# Patient Record
Sex: Female | Born: 1985 | Race: White | Hispanic: No | Marital: Single | Smoking: Former smoker
Health system: Southern US, Academic
[De-identification: ages and names within clinical notes are randomized; demographics above are authoritative.]

## PROBLEM LIST (undated history)

## (undated) DIAGNOSIS — F419 Anxiety disorder, unspecified: Secondary | ICD-10-CM

## (undated) DIAGNOSIS — R51 Headache: Secondary | ICD-10-CM

## (undated) DIAGNOSIS — F32A Depression, unspecified: Secondary | ICD-10-CM

## (undated) DIAGNOSIS — B019 Varicella without complication: Secondary | ICD-10-CM

## (undated) DIAGNOSIS — R519 Headache, unspecified: Secondary | ICD-10-CM

## (undated) DIAGNOSIS — F329 Major depressive disorder, single episode, unspecified: Secondary | ICD-10-CM

## (undated) DIAGNOSIS — J45909 Unspecified asthma, uncomplicated: Secondary | ICD-10-CM

## (undated) DIAGNOSIS — N289 Disorder of kidney and ureter, unspecified: Secondary | ICD-10-CM

## (undated) DIAGNOSIS — K469 Unspecified abdominal hernia without obstruction or gangrene: Secondary | ICD-10-CM

## (undated) DIAGNOSIS — I519 Heart disease, unspecified: Secondary | ICD-10-CM

## (undated) HISTORY — DX: Disorder of kidney and ureter, unspecified: N28.9

## (undated) HISTORY — DX: Heart disease, unspecified: I51.9

## (undated) HISTORY — DX: Headache: R51

## (undated) HISTORY — PX: OTHER SURGICAL HISTORY: SHX169

## (undated) HISTORY — DX: Varicella without complication: B01.9

## (undated) HISTORY — PX: HERNIA REPAIR: SHX51

## (undated) HISTORY — PX: BREAST SURGERY: SHX581

---
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
## 2004-12-28 ENCOUNTER — Ambulatory Visit (HOSPITAL_COMMUNITY): Payer: Self-pay

## 2005-06-22 ENCOUNTER — Ambulatory Visit (INDEPENDENT_AMBULATORY_CARE_PROVIDER_SITE_OTHER): Payer: Self-pay

## 2005-06-25 ENCOUNTER — Ambulatory Visit (INDEPENDENT_AMBULATORY_CARE_PROVIDER_SITE_OTHER): Payer: Self-pay | Admitting: Hospital-Specialty Hospital

## 2005-07-13 ENCOUNTER — Ambulatory Visit (INDEPENDENT_AMBULATORY_CARE_PROVIDER_SITE_OTHER): Payer: Self-pay | Admitting: Hospital-Specialty Hospital

## 2005-08-06 ENCOUNTER — Emergency Department (HOSPITAL_COMMUNITY): Payer: Self-pay | Admitting: Emergency Medicine

## 2005-09-12 ENCOUNTER — Ambulatory Visit (HOSPITAL_COMMUNITY): Payer: Self-pay | Admitting: Hospital-Specialty Hospital

## 2005-09-14 ENCOUNTER — Ambulatory Visit (INDEPENDENT_AMBULATORY_CARE_PROVIDER_SITE_OTHER): Payer: Self-pay

## 2005-12-12 ENCOUNTER — Ambulatory Visit (INDEPENDENT_AMBULATORY_CARE_PROVIDER_SITE_OTHER): Payer: Self-pay | Admitting: Hospital-Specialty Hospital

## 2005-12-15 ENCOUNTER — Ambulatory Visit (HOSPITAL_COMMUNITY): Payer: Self-pay | Admitting: Obstetrics & Gynecology

## 2006-01-08 ENCOUNTER — Ambulatory Visit (INDEPENDENT_AMBULATORY_CARE_PROVIDER_SITE_OTHER): Payer: Self-pay | Admitting: Hospital-Specialty Hospital

## 2006-01-11 ENCOUNTER — Ambulatory Visit (INDEPENDENT_AMBULATORY_CARE_PROVIDER_SITE_OTHER): Payer: Self-pay | Admitting: Hospital-Specialty Hospital

## 2006-01-15 ENCOUNTER — Ambulatory Visit (INDEPENDENT_AMBULATORY_CARE_PROVIDER_SITE_OTHER): Payer: Self-pay | Admitting: Hospital-Specialty Hospital

## 2006-01-18 ENCOUNTER — Ambulatory Visit (INDEPENDENT_AMBULATORY_CARE_PROVIDER_SITE_OTHER): Payer: Self-pay

## 2006-01-20 ENCOUNTER — Ambulatory Visit (HOSPITAL_COMMUNITY): Payer: Self-pay

## 2006-01-22 ENCOUNTER — Ambulatory Visit (INDEPENDENT_AMBULATORY_CARE_PROVIDER_SITE_OTHER): Payer: Self-pay | Admitting: Hospital-Specialty Hospital

## 2006-01-25 ENCOUNTER — Inpatient Hospital Stay (HOSPITAL_COMMUNITY): Payer: Self-pay | Admitting: Obstetrics & Gynecology

## 2007-07-30 ENCOUNTER — Ambulatory Visit (INDEPENDENT_AMBULATORY_CARE_PROVIDER_SITE_OTHER): Payer: Self-pay | Admitting: GENERAL SURGERY

## 2007-08-18 ENCOUNTER — Emergency Department (EMERGENCY_DEPARTMENT_HOSPITAL): Admission: EM | Admit: 2007-08-18 | Discharge: 2007-08-18 | Payer: Self-pay

## 2007-08-18 ENCOUNTER — Encounter (EMERGENCY_DEPARTMENT_HOSPITAL): Payer: Self-pay

## 2007-10-22 ENCOUNTER — Encounter (INDEPENDENT_AMBULATORY_CARE_PROVIDER_SITE_OTHER): Payer: Self-pay | Admitting: GENERAL SURGERY

## 2007-10-29 ENCOUNTER — Encounter (INDEPENDENT_AMBULATORY_CARE_PROVIDER_SITE_OTHER): Payer: 59 | Admitting: GENERAL SURGERY

## 2007-10-29 ENCOUNTER — Encounter (HOSPITAL_COMMUNITY): Payer: Self-pay

## 2007-10-29 ENCOUNTER — Ambulatory Visit
Admission: RE | Admit: 2007-10-29 | Discharge: 2007-10-29 | Disposition: A | Discharge status: Home / Self Care | Payer: 59 | Attending: GENERAL SURGERY | Admitting: GENERAL SURGERY

## 2007-10-29 DIAGNOSIS — Z01818 Encounter for other preprocedural examination: Secondary | ICD-10-CM | POA: Insufficient documentation

## 2007-10-29 HISTORY — DX: Unspecified asthma, uncomplicated: J45.909

## 2007-10-29 HISTORY — DX: Unspecified abdominal hernia without obstruction or gangrene: K46.9

## 2007-10-29 LAB — CBC/DIFF
BASOPHILS: 0 % (ref 0–1)
BASOS ABS: 0.029 THOU/uL (ref 0.0–0.2)
EOS ABS: 0.203 THOU/uL (ref 0.1–0.3)
EOSINOPHIL: 3 % (ref 1–6)
HCT: 40.1 % (ref 33.5–45.2)
HGB: 13.1 g/dL (ref 11.2–15.2)
LYMPHOCYTES: 49 % — ABNORMAL HIGH (ref 20–45)
LYMPHS ABS: 3.27 THOU/uL (ref 1.0–4.8)
MCH: 27.4 pg (ref 27.4–33.0)
MCHC: 32.8 g/dL (ref 31.6–35.5)
MCV: 83.6 fL (ref 78–100)
MONOCYTES: 5 % (ref 4–13)
MONOS ABS: 0.339 THOU/uL (ref 0.1–0.9)
MPV: 8.5 FL (ref 7.4–10.4)
PLATELET COUNT: 339 THO/UL (ref 140–450)
PMN ABS: 2.94 THOU/uL (ref 1.3–7.7)
PMN'S: 43 % (ref 40–75)
RBC: 4.8 MIL/uL (ref 3.84–5.04)
RDW: 12.3 % (ref 11.5–14.5)
WBC: 6.8 THOU/UL (ref 3.5–11.0)

## 2007-10-29 LAB — PTT (PARTIAL THROMBOPLASTIN TIME): APTT: 28.6 s (ref 22.5–32.0)

## 2007-10-29 LAB — ELECTROLYTES
ANION GAP: 8 mmol/L (ref 5–16)
CARBON DIOXIDE: 26 mmol/L (ref 22–32)
CHLORIDE: 102 mmol/L (ref 96–111)
POTASSIUM: 3.4 mmol/L — ABNORMAL LOW (ref 3.5–5.1)
SODIUM: 136 mmol/L (ref 136–145)

## 2007-10-29 LAB — URINALYSIS, MACROSCOPIC AND MICROSCOPIC
GLUCOSE: NEGATIVE mg/dL
KETONES: NEGATIVE mg/dL
LEUKOCYTES: NEGATIVE
NITRITE: NEGATIVE
PH URINE: 5.5 (ref 5.0–8.0)
SPECIFIC GRAVITY, URINE: 1.018 (ref 1.005–1.030)
WBC'S: 1 /HPF (ref 0–6)

## 2007-10-29 LAB — CREATININE WITH EGFR: ESTIMATED GLOMERULAR FILTRATION RATE: >59 ml/min/1.73m2 (ref >59)

## 2007-10-29 LAB — BUN: BUN/CREAT RATIO: 17 (ref 6–22)

## 2007-10-29 LAB — PT/INR
INR: 1 (ref 0.8–1.2)
PROTHROMBIN TIME: 10.7 s (ref 9.1–11.2)

## 2007-10-29 NOTE — Nurses Notes (Signed)
Pt states her PPD was "red" around the site and was told to get a yearly CXR.  She stated her CXR has been negative.[facility] 5/09 was last one done)

## 2007-10-30 ENCOUNTER — Ambulatory Visit (HOSPITAL_COMMUNITY): Payer: 59

## 2007-11-01 MED ORDER — DEXTROSE 5 % IN WATER (D5W) INTRAVENOUS SOLUTION
600.00 mg | Freq: Once | INTRAVENOUS | Status: DC
Start: 2007-11-03 — End: 2007-11-04
  Filled 2007-11-01: qty 4

## 2007-11-03 ENCOUNTER — Other Ambulatory Visit (HOSPITAL_COMMUNITY): Payer: Self-pay | Admitting: GENERAL SURGERY

## 2007-11-03 ENCOUNTER — Inpatient Hospital Stay
Admission: RE | Admit: 2007-11-03 | Discharge: 2007-11-03 | Disposition: A | Discharge status: Home / Self Care | Payer: 59 | Attending: GENERAL SURGERY | Admitting: GENERAL SURGERY

## 2007-11-03 ENCOUNTER — Encounter (HOSPITAL_BASED_OUTPATIENT_CLINIC_OR_DEPARTMENT_OTHER): Payer: 59 | Admitting: GENERAL SURGERY

## 2007-11-03 ENCOUNTER — Encounter (HOSPITAL_COMMUNITY): Payer: Self-pay

## 2007-11-03 ENCOUNTER — Encounter (HOSPITAL_COMMUNITY)
Admission: RE | Disposition: A | Discharge status: Home / Self Care | Payer: Self-pay | Source: Ambulatory Visit | Attending: GENERAL SURGERY

## 2007-11-03 DIAGNOSIS — K429 Umbilical hernia without obstruction or gangrene: Secondary | ICD-10-CM | POA: Insufficient documentation

## 2007-11-03 DIAGNOSIS — F17200 Nicotine dependence, unspecified, uncomplicated: Secondary | ICD-10-CM | POA: Insufficient documentation

## 2007-11-03 SURGERY — REPAIR HERNIA VENTRAL
Anesthesia: General | Wound class: Clean Wound: Uninfected operative wounds in which no inflammation occurred

## 2007-11-03 MED ORDER — OXYCODONE-ACETAMINOPHEN 5 MG-325 MG TABLET
ORAL_TABLET | ORAL | Status: AC
Start: 2007-11-03 — End: 2007-11-03
  Filled 2007-11-03: qty 1

## 2007-11-03 MED ORDER — OXYCODONE-ACETAMINOPHEN 5 MG-325 MG TABLET
1.0000 | ORAL_TABLET | ORAL | Status: DC | PRN
Start: 2007-11-03 — End: 2007-11-04
  Administered 2007-11-03 (×2): 1 via ORAL

## 2007-11-03 MED ORDER — OXYCODONE-ACETAMINOPHEN 5 MG-325 MG TABLET
1.00 | ORAL_TABLET | ORAL | Status: DC | PRN
Start: 2007-11-03 — End: 2009-03-04

## 2007-11-03 MED ORDER — LACTATED RINGERS INTRAVENOUS SOLUTION
INTRAVENOUS | Status: DC
Start: 2007-11-03 — End: 2007-11-04

## 2007-11-03 MED ORDER — METOCLOPRAMIDE 10 MG TABLET
10.0000 mg | ORAL_TABLET | Freq: Three times a day (TID) | ORAL | Status: DC | PRN
Start: 2007-11-03 — End: 2007-11-04
  Filled 2007-11-03: qty 1

## 2007-11-03 MED ORDER — BUPIVACAINE (PF) 0.25 % (2.5 MG/ML) INJECTION SOLUTION
Freq: Once | INTRAMUSCULAR | Status: DC | PRN
Start: 2007-11-03 — End: 2007-11-03
  Administered 2007-11-03: 7 mL via INTRAMUSCULAR

## 2007-11-03 SURGICAL SUPPLY — 15 items
APPL 70% ISPRP 2% CHG 26ML CHLRPRP HI-LT ORNG PREP STRL LF  DISP CLR (WOUND CARE SUPPLY) ×1 IMPLANT
BLANKET 3M BAIR HUG ADLT UPR B ODY 74X24IN PLMR 2 INCS ADH (MISCELLANEOUS PT CARE ITEMS) ×1 IMPLANT
CONV USE ITEM 156524 - ADHESIVE TISSUE EXOFIN 1.0ML_PREMIERPRO EXOFIN (SEALANTS) IMPLANT
CONV USE ITEM 338653 - PACK SURG ABDOMINAL NONST DISP LF (CUSTOM TRAYS & PACK) ×1 IMPLANT
DONUT EXTREMITY CUSHIONING 31143137 (POSITIONING PRODUCTS) ×1 IMPLANT
DRESS WOUND PRMPR NWVN LF  STRL DISP (WOUND CARE SUPPLY) ×1 IMPLANT
KIT RM TURNOVER CLEANOP CSTM INFCT CONTROL (KITS & TRAYS (DISPOSABLE))
KIT RM TURNOVER CLEANOP CUSTOM INFCT CONTROL (KITS & TRAYS (DISPOSABLE)) IMPLANT
NEEDLE 22 GA X 1.5 1188822112 100EA/BX (NEEDLES & SYRINGE SUPPLIES) ×1 IMPLANT
PACK SURG ABDOMINAL NONST DISP LF (CUSTOM TRAYS & PACK) ×1
PAD ARMBRD BLU (POSITIONING PRODUCTS) ×2 IMPLANT
SLEEVE SCD EXPRESS KNEE REG 5 PER CASE 9529 (EQUIPMENT MINOR) ×1 IMPLANT
STAPLER SKIN 4.1X6.5MM 35 W STPL CART LF  APS U DISP CLR SS PLASTIC (ENDOSCOPIC SUPPLIES) IMPLANT
SUTURE TICRON 0 HGS22 TAPER POPOFF NDL 8886337962 24/BX (SUTURE/WOUND CLOSURE) ×1 IMPLANT
SYRINGE 12CC LL CONTROL TOP 8881512977 160/CS (Syringes w/ o Needles) ×1 IMPLANT

## 2007-11-03 NOTE — Discharge Instructions (Signed)
SURGICAL DISCHARGE INSTRUCTIONS     Dr. Roberto Scales  performed your REPAIR HERNIA VENTRAL today at the [facility]    [facility]:  Monday through Friday from 6 a.m. - 7 p.m.: [phone]  Between 7 p.m. - 6 a.m., weekends and holidays:  Call [facility] at [phone] or [phone].    PLEASE SEE WRITTEN HANDOUTS AS DISCUSSED BY YOUR NURSE:  Hernia repair    SIGNS AND SYMPTOMS OF A WOUND / INCISION INFECTION   Be sure to watch for the following:   Increase in redness or red streaks near or around the wound or incision.  Increase in pain that is intense or severe and cannot be relieved by the pain medication that your doctor has given you.  Increase in swelling that cannot be relieved by elevation of a body part, or by applying ice, if permitted.  Increase in drainage, or if yellow / green in color and smells bad. This could be on a dressing or a cast.  Increase in fever for longer than 24 hours, or an increase that is higher than 101 degrees Fahrenheit (normal body temperature is 98 degrees Fahrenheit). The incision may feel warm to the touch.    **CALL YOUR DOCTOR IF ONE OR MORE OF THESE SIGNS / SYMPTOMS SHOULD OCCUR.    ANESTHESIA INFORMATION   ANESTHESIA -- ADULT PATIENTS:  You have received intravenous sedation / general anesthesia, and you may feel drowsy and light-headed for several hours. You may even experience some forgetfulness of the procedure. DO NOT DRIVE A MOTOR VEHICLE or perform any activity requiring complete alertness or coordination until you feel fully awake in about 24-48 hours. Do not drink alcoholic beverages for at least 24 hours. Do not stay alone, you must have a responsible adult available to be with you. You may also experience a dry mouth or nausea for 24 hours. This is a normal side effect and will disappear as the effects of the medication wear off.    REMEMBER   If you experience any difficulty breathing, chest pain, bleeding that you feel is  excessive, persistent nausea or vomiting or for any other concerns:  Call your physician Dr. Luiz Blare at [phone] or [phone]. You may also ask to have the surgical doctor on call paged. They are available to you 24 hours a day.    SPECIAL INSTRUCTIONS / COMMENTS   none    FOLLOW-UP APPOINTMENTS   Please call patient services at [phone] or [phone] to schedule a date / time of return. They are open Monday - Friday from 7:30 am - 5:00 pm.

## 2007-11-03 NOTE — Nurses Notes (Signed)
Pt. Started menses on 10/25/07, Dr. Raechel Ache d/cd order for [facility].

## 2007-11-03 NOTE — OR PostOp (Signed)
Dc to home via w/c with [facility].

## 2007-11-04 LAB — HISTORICAL SURGICAL PATHOLOGY SPECIMEN

## 2007-11-04 NOTE — OR Surgeon (Signed)
[facility]   DEPARTMENT OF SURGERY   OPERATION SUMMARY    PATIENT NAME: Anne Cummings, Anne Cummings  HOSPITAL BMWUXL:[id number]  DATE OF SERVICE:11/03/2007  DATE OF BIRTH: 19-May-1985    PREOPERATIVE DIAGNOSIS: Umbilical hernia.    POSTOPERATIVE DIAGNOSIS: Umbilical hernia.    NAME OF PROCEDURE: Umbilical hernia repair.    SURGEONS: Jonnie Finner, MD, Marguerite Olea MD.    ANESTHESIA: General endotracheal anesthesia.    SPECIMENS: Hernia sac.    ANTIBIOTICS: Clindamycin.    FLUIDS: 700 mL of crystalloid.     URINE OUTPUT: 25 mL.    ESTIMATED BLOOD LOSS: Minimal.     DESCRIPTION OF PROCEDURE: After informed consent was obtained, the patient was brought to the operating room and placed on the operating room table in supine position. The patient underwent general endotracheal anesthesia. The patient was prepped and draped in a sterile fashion. An approximately 3-cm curvilinear incision was made in a transverse fashion in the supraumbilical position. The incision was carried down through the skin and subcutaneous tissue. The dissection was carried down to the level of the fascia. The umbilical stalk was amputated from the fascia. After elevating the umbilical stalk, a small umbilical defect was noted above the site where the umbilical stalk was implanted. The defect was approximately 1 cm in diameter. The fascia was cleared of its overlying tissue. This tissue was sent as a hernia sac. The level of the umbilical stalk was also noted to have a small defect in the fascia and the 2 were connected. This made an approximately 1.5-cm defect. The fascial edges were reapproximated using 0 Tycron suture in a figure-of-eight suture fashion. Three sutures were placed. The wound was irrigated with saline solution. The umbilical stalk was reattached to the fascia using 3-0 Polysorb in an interrupted fashion. Deep dermal sutures were placed in an interrupted suture fashion and the skin edges were reapproximated using 4-0 Polysorb in a subcuticular suture fashion. The patient tolerated the procedure well and was without apparent complication. Sponge and instrument count was correct at the completion of the procedure and I was scrubbed and present throughout the entire procedure.      Roberto Scales, MD  Assistant Professor  [facility] Department of Surgery    [id number]; D: 11/03/2007 59:56:38; T: 11/04/2007 12:58:47

## 2007-11-14 ENCOUNTER — Encounter (INDEPENDENT_AMBULATORY_CARE_PROVIDER_SITE_OTHER): Payer: 59

## 2007-11-17 NOTE — Progress Notes (Signed)
I saw and evaluated the patient. I reviewed the resident's note. I agree with the findings and plan of care as documented in the resident's note. Any exceptions/additions are noted.

## 2007-11-17 NOTE — H&P (Signed)
 [facility] Department of Surgery  [address]  [place], [place] [address]      HISTORY AND PHYSICAL    PATIENT NAME: Anne Cummings, Anne Cummings  CHART NUMBER: [id number]  DATE OF BIRTH: 03/27/1986  DATE OF SERVICE: 10/29/2007    CHIEF COMPLAINT:  Ventral hernia.    SUBJECTIVE:  Anne Cummings is a 22 year old female who initially presented in April with complaint of an 34-month enlarging supraumbilical ventral hernia.  She states that the hernia not been changing in size.  This does cause discomfort when she is active and lifting items.  She has not noticed any redness or swelling in the area of the hernia.  Per the patient, the hernia is reducible.  She has not had any change in her bowel pattern, such as constipation or diarrhea.  She has not noted any blood in her stool.      PAST MEDICAL HISTORY:  Gestational diabetes, resolved.      PAST SURGICAL HISTORY:  Status post C section in September 2007.    MEDICATIONS:  Tri-Sprintec .      ALLERGIES:    1.  Nonsteroidal anti-inflammatory drugs cause anaphylaxis.  2.  Penicillin, which causes rash.     FAMILY HISTORY:    1.  Grandfather with umbilical hernia.  2.  Diabetes in mom and several aunts.  3.  Both parents with hypertension.      SOCIAL HISTORY:  The patient does smoke one-half pack per day.  Consumes 1-2 alcoholic drinks per week.  No drugs.  Single.  Lives with her son.      REVIEW OF SYSTEMS:  Constitutional:  The patient does report an intentional 10-pound weight loss over the past several months.  No fever, no chills, no night sweats.  Cardiovascular:  No chest pain, no palpitations.  Respiratory:  No dyspnea, no cough. GI:  No diarrhea, no constipation, no blood per rectum.  No nausea, vomiting.  GU:  No dysuria.  No frequency.  Reproductive:  LMP 10/23/2007, lasted 3 days.      OBJECTIVE:  Vital Signs:  Heart rate 68, blood pressure 100/60, respirations 16, temperature 97.4.  Weight 111 pounds.  On physical examination, constitutional:  Well-kempt  patient appearing normal age and in no acute distress.  HEENT:  Pupils equal, round, and reactive to light and accommodation.  No lymphadenopathy noted.  Cardiovascular:  Regular rate and rhythm.  Normal S1, S2.  No murmur, gallop, or thrill.  Respiratory:  Clear to auscultation bilaterally.  GI:  Abdomen:  There is a palpable mass about 3 cm above the umbilicus measuring 5-6 cm in length.  The actual fascial defect is difficult to palpate.  There is diastasis of the rectus abdominis muscles.  Musculoskeletal:  Range of motion is normal.  There is no edema.  Neurologic:  Alert and oriented x3.  No focal deficits.      ASSESSMENT/PLAN:  Ventral hernia - The patient is scheduled for open operative repair of her ventral hernia for 11/03/2007 by Dr. Yvone.  The patient was consented.  The risks and benefits and complications of the procedure were explained to the patient.  She is agreeable to the consented procedure and signed copy is placed in the chart.  Preoperative anesthesia consult with labs and preoperative orders were given to the patient for her arrival on Monday morning.      Selinda Bihari, MD  Resident   Department of Surgery    Montie JULIANNA Yvone, MD  Assistant Professor  [facility] Department of Surgery    [id number]; D: 10/29/2007 14:09:56; T: 10/29/2007 14:40:43

## 2007-11-19 ENCOUNTER — Encounter (INDEPENDENT_AMBULATORY_CARE_PROVIDER_SITE_OTHER): Payer: 59 | Admitting: GENERAL SURGERY

## 2007-11-27 NOTE — Progress Notes (Signed)
[facility] Department of Surgery  [address]  [place], [place] [address]      PROGRESS NOTE    PATIENT NAME: Anne Cummings, Anne Cummings  CHART NUMBER: [id number]  DATE OF BIRTH: [birth year]  DATE OF SERVICE: 11/14/2007    REASON FOR OFFICE VISIT: Wound swelling after open umbilical hernia repair.    SUBJECTIVE: Anne Cummings is a patient of Dr. Luiz Blare, she had open umbilical hernia repair. She was complaining of wound swelling. No other complaints. No fever, no chills.    OBJECTIVE: On physical examination, vital signs are afebrile. Heart: S1, S2. Chest: Clear to auscultation. Abdomen was soft, nontender, nondistended, normal bowel sounds. Incisions healed well, except the periphery that was open and draining seroma.    ASSESSMENT AND PLAN: The patient was assessed to have a seroma that was drained by applying pressure at the bedside and there are no signs of infection. The patient was given a prescription for a binder and will follow up with Dr. Luiz Blare.      Zollie Beckers, MD  Assistant Professor  [facility] Department of Surgery    [id number]; D: 11/14/2007 15:15:10; T: 11/18/2007 13:08:65    cc: Roberto Scales MD   Shirleen Schirmer

## 2007-12-21 NOTE — Progress Notes (Signed)
[facility] Department of Surgery  [address]  [place], [place] [address]      PROGRESS NOTE    PATIENT NAME: Anne Cummings, Anne Cummings  CHART NUMBER: [id number]  DATE OF BIRTH: [birth year]  DATE OF SERVICE: 11/14/2007    Today's visit is a followup, unscheduled visit.     REASON FOR VISIT: Possible wound infection.    SUBJECTIVE: The patient presents to the clinic today ten days status post ventral abdominal hernia repair. No mesh was used. The patient complains of fluid leaking from the lateral right side of her incision, and also a fluid collection underneath the entire area of the incision. The patient denies any appearance of pus. States that it is more clear and pink in color. She has not had any fever or chills. The incision area has not been red, tender, or warm. She has not taken any antibiotics.     OBJECTIVE: The patient's vital signs are stable. She is afebrile. Examination of the abdomen reveals approximately 8 cm by 8 cm firm area in the periumbilical region. With mild pressure, there is a serosanguineous clear fluid expressed from the right lateral margin of the incision. Approximately 15 to 20 mL of serous fluid is expressed. There is no evidence for exudate or infection. The incision has healed over except for an area in the right lateral portion, which is approximately the size of a 27 gauge needle (as it is very small). The rest of the patient's abdomen is soft, nontender, and nondistended and active bowel sounds.      ASSESSMENT/PLAN: Postoperative fluid accumulation is most consistent with a seroma. The fluid was evacuated with gentle pressure approximately 15 to 20 mL in total. The incision was then re-covered with a 4 by 4 dressing and taped. We advised the patient that there is no sign of infection and that antibiotics are not currently indicated. The patient was given a prescription for an abdominal binder to close the potential space to prevent re-collection of the seroma. The patient was advised to followup with her scheduled appointment with Dr. Luiz Blare in approximately five days. If she has any further complications, any signs of infection such a fever, chills, redness, tenderness, or erythema, she is to seek medical care at the emergency department.     The patient was seen in conjunction with Dr. Zollie Beckers covering for Dr. Jonnie Finner.      Marguerite Olea, MD  Resident  [place] Department of Surgery    Roberto Scales, MD  Assistant Professor  [facility] Department of Surgery    [id number]; D: 11/14/2007 14:44:25; T: 11/18/2007 07:41:12

## 2007-12-21 NOTE — Progress Notes (Signed)
I saw and evaluated the patient. I reviewed the resident's note. I agree with the findings and plan of care as documented in the resident's note. Any exceptions/additions are noted.

## 2007-12-25 NOTE — Progress Notes (Signed)
 [facility] Department of Surgery  [address]  [place], [place] [address]      PROGRESS NOTE    PATIENT NAME: Anne Cummings, Anne Cummings  CHART NUMBER: [id number]  DATE OF BIRTH: Mar 08, 1986  DATE OF SERVICE: 11/19/2007    CHIEF COMPLAINT: Status post ventral hernia repair.     SUBJECTIVE: Anne Cummings is a 22 year old female who presents to clinic today status post ventral hernia repair. She has had decreased drainage and fluid output from the incision site since her last visit. She denies any fever or chills. She denies any pain at the incision site. She denies any erythema, swelling or tenderness. She states that the fluid is clear, straw-colored fluid, which has decreased over the past several days in quantity. She has not noticed any purulence from the incision.     OBJECTIVE: Pulse 82, blood pressure 112/68, respirations 20, temperature 99, height 5 feet, and weight 111 pounds. Constitutional: Well appearing and in no acute distress. HEENT: Pupils are equal, round, and reactive to light. Extraocular muscles are intact. Cardiovascular: Regular rate and rhythm, no murmur, rub or gallop. Chest: Clear to auscultation bilaterally. No adventitious sounds. GI: Soft, nontender, and nondistended. Active bowel sounds. The incision site is healed satisfactorily. There is no fluid expressed from the site. There is no evidence of residual seroma. There is no erythema or tenderness. GU: There is no flank tenderness. Musculoskeletal: Strength is 5/5 throughout. Neurologic: Conscious, alert and oriented x3. Exam is nonfocal.     ASSESSMENT/PLAN: Status post ventral abdominal hernia repair. The patient is doing quite well postoperatively. The incision is healed satisfactorily. The patient is to follow up on an as-needed basis.      Selinda Bihari, MD  Resident  [place] Department of Surgery    Montie JULIANNA Gavel, MD  Assistant Professor  [facility] Department of Surgery    [id number]; D: 11/19/2007 17:19:00; T: 11/21/2007  10:42:47

## 2007-12-25 NOTE — Progress Notes (Signed)
I saw and evaluated the patient. I reviewed the resident's note. I agree with the findings and plan of care as documented in the resident's note. Any exceptions/additions are noted.

## 2008-04-30 DIAGNOSIS — Z8759 Personal history of other complications of pregnancy, childbirth and the puerperium: Secondary | ICD-10-CM

## 2008-04-30 HISTORY — DX: Personal history of other complications of pregnancy, childbirth and the puerperium: Z87.59

## 2008-04-30 HISTORY — PX: HX SALPINGECTOMY: 2100001146

## 2008-05-11 ENCOUNTER — Ambulatory Visit (INDEPENDENT_AMBULATORY_CARE_PROVIDER_SITE_OTHER): Payer: Self-pay | Admitting: Dermatology

## 2008-06-05 ENCOUNTER — Encounter (EMERGENCY_DEPARTMENT_HOSPITAL): Payer: MEDICAID

## 2008-06-05 ENCOUNTER — Emergency Department (EMERGENCY_DEPARTMENT_HOSPITAL): Admission: EM | Admit: 2008-06-05 | Discharge: 2008-06-05 | Payer: Self-pay

## 2009-03-04 ENCOUNTER — Encounter (EMERGENCY_DEPARTMENT_HOSPITAL): Payer: Worker's Comp, Other unspecified

## 2009-03-04 ENCOUNTER — Emergency Department
Admission: EM | Admit: 2009-03-04 | Discharge: 2009-03-04 | Disposition: A | Discharge status: Home / Self Care | Payer: Worker's Comp, Other unspecified | Attending: Emergency Medicine-WVUH | Admitting: Emergency Medicine-WVUH

## 2009-03-04 ENCOUNTER — Encounter (HOSPITAL_COMMUNITY): Payer: Self-pay

## 2009-03-04 DIAGNOSIS — Z7721 Contact with and (suspected) exposure to potentially hazardous body fluids: Secondary | ICD-10-CM | POA: Insufficient documentation

## 2009-03-04 LAB — DRUG SCREEN, HIGH OPIATE CUTOFF, NO CONFIRMATION, URINE
AMPHETAMINE, URINE: NEGATIVE
BARBITURATE, URINE: NEGATIVE
BENZODIAZEPINE,URINE: NEGATIVE
CANNABINOID (THC), URINE: NEGATIVE
COCAINE METAB. URINE: NEGATIVE
METHADONE, URINE: NEGATIVE
OPIATE, URINE: NEGATIVE
PHENCYCLIDINE, URINE: NEGATIVE
PROPOXYPHENE, URINE: NEGATIVE

## 2009-03-04 LAB — CBC/DIFF
BASOPHILS: 0 % (ref 0–1)
BASOS ABS: 0.047 THOU/uL (ref 0.0–0.2)
EOS ABS: 0.394 THOU/uL — ABNORMAL HIGH (ref 0.1–0.3)
EOSINOPHIL: 4 % (ref 1–6)
HCT: 38.7 % (ref 33.5–45.2)
HGB: 12.9 g/dL (ref 11.5–15.2)
LYMPHOCYTES: 41 % (ref 20–45)
LYMPHS ABS: 4.2 THOU/uL (ref 1.0–4.8)
MCH: 28 pg (ref 27.4–33.0)
MCHC: 33.4 g/dL (ref 31.6–35.5)
MCV: 83.9 fL (ref 82.0–99.0)
MONOCYTES: 6 % (ref 4–13)
MONOS ABS: 0.633 THOU/uL (ref 0.1–0.9)
MPV: 8.3 FL (ref 7.4–10.4)
NRBC'S: 0 /100{WBCs}
PLATELET COUNT: 323 THO/UL (ref 140–450)
PMN ABS: 5.1 THOU/uL (ref 1.5–7.7)
PMN'S: 49 % (ref 40–75)
RBC: 4.61 MIL/uL (ref 3.84–5.04)
RDW: 11.9 % (ref 10.2–14.0)
WBC: 10.4 THOU/UL (ref 3.5–11.0)

## 2009-03-04 LAB — BASIC METABOLIC PANEL
ANION GAP: 9 mmol/L (ref 5–16)
BUN/CREAT RATIO: 18 (ref 6–22)
BUN: 10 mg/dL (ref 6–20)
CALCIUM: 9.1 mg/dL (ref 8.5–10.4)
CARBON DIOXIDE: 27 mmol/L (ref 22–32)
CHLORIDE: 104 mmol/L (ref 96–111)
CREATININE: 0.56 mg/dL (ref 0.49–1.10)
ESTIMATED GLOMERULAR FILTRATION RATE: >59 ml/min/1.73m2 (ref >59)
GLUCOSE,NONFAST: 98 mg/dL (ref 65–139)
POTASSIUM: 4 mmol/L (ref 3.5–5.1)
SODIUM: 140 mmol/L (ref 136–145)

## 2009-03-04 LAB — ALT (SGPT): ALT (SGPT): 10 U/L (ref 7–45)

## 2009-03-04 LAB — AST (SGOT): AST (SGOT): 17 U/L (ref 8–41)

## 2009-03-04 LAB — RAPID HIV1 ANTIBIODY (ED USE): RAPID HIV-1 Ab: NEGATIVE

## 2009-03-04 NOTE — ED Nurses Note (Signed)
Pt discharged per order. Pt verbalized understanding of discharge instructions . Pt ambulated from ED with no problem

## 2009-03-04 NOTE — ED Provider Notes (Signed)
HPI The pt is a 23 yo F who presents 2 days S/P needlestick from her workplace.  On 03/02/09 the pt was exposed to a pt of her's lancet.  The pt states her pt placed a used lancet to test his blood sugar in a paper cup.  She found the cup with the used lancet and attempted to shake the lancet into sharps container.  When she did this the lancet stuck through the side of the paper cup and stuck the pt's left 2nd digit.  The pt then went to her supervisor and HR and made a report.  The pt states her RN supervisor is currently taking the appropriate actions to obtain consent from the sourceof the exposure for testing. No further action was taken per the pt. The pt was told by her employer that she will need to be tested. The pt was told to go to the ED or urgent care for testing within the next few days.  The pt called [facility] and the [facility] urgent care and was told to [facility] the ED.  The pt present tonight wanting testing for a needlestick exposure.  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          Review of Systems   Constitutional: Negative for fever, chills, malaise/fatigue and weakness.   Skin: Negative for rash.   HENT: Negative for headaches and sore throat.    Gastrointestinal: Negative for nausea, vomiting and abdominal pain.   Musculoskeletal: Negative for myalgias and joint pain.   Neurological: Negative for dizziness.   All other systems reviewed and are negative.          History:   Filed Vitals:    03/04/2009  7:52 PM   BP: 110/62   Pulse: 84   Temp: 36.9 C (98.4 F)   Resp: 18   SpO2: 98%       Past Medical History:  Past Medical History   Diagnosis Date   . Hernia      r/t pregnancy   . Asthma          Past Surgical History:  Past Surgical History   Procedure Date   . Hx cesarean section          Social History:  History   Substance Use Topics   . Tobacco Use: Never   . Alcohol Use: No      2x month       History   Drug Use No          Family History:  History reviewed.  No pertinent family history.    Current outpatient prescriptions   Medication Sig   . DISCONTD: oxycodone-acetaminophen (PERCOCET) 5-325 mg Tab take 1-2 Tabs by mouth Every 4 hours as needed    . oral contraceptive (PATIENT'S OWN SUPPLY) take 1 Tab by mouth Once a day.        Allergies   Allergen Reactions   . Nsaids Anaphylaxis   . Naproxen Anaphylaxis   . Penicillins Rash     "fever blisters in & around mouth"            Physical Exam   Nursing note and vitals reviewed.  Constitutional: She is oriented. She appears well-developed and well-nourished. She appears not diaphoretic. No distress.   HENT:   Head: Normocephalic and atraumatic.   Right Ear: External ear normal.   Left Ear:  External ear normal.   Nose: Nose normal.   Mouth/Throat: Oropharynx is clear and moist.   Eyes: Conjunctivae and extraocular motions are normal. Pupils are equal, round, and reactive to light.   Neck: Normal range of motion.   Cardiovascular: Normal rate, regular rhythm, normal heart sounds and intact distal pulses.    Pulmonary/Chest: Effort normal and breath sounds normal. No respiratory distress.   Abdominal: Bowel sounds are normal. She exhibits no distension. Soft. No tenderness.   Musculoskeletal: Normal range of motion. She exhibits no edema and no tenderness.   Lymphadenopathy:     She has no cervical adenopathy.   Neurological: She is alert and oriented.   Skin: Skin is dry. No rash noted. She is not diaphoretic.   Psychiatric: She has a normal mood and affect. Her behavior is normal. Judgment and thought content normal.           ED Course:    Results:     ED Course: The following lab work was obtained and sent; CBC w/ diff, BMP, ALT, AST, rapid HIV, Hep. B surface antigen, Hep. B core antibody, Hep. B core IGM, AB, Hep. C antibody.    The pt has had the Hep. 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    The pt was advised she will need the following lab test repeated 6 months from the exposure date: HIV AB, HCV AB, Liver function tests.    Dr. Alla German and I went into discuss the Urine drug screen with the pt. The pt decided to have the UDS performed.      Risks, benefits, and indications of antiviral medications was discussed with the pt by myself and Dr. Alla German, as well as potential alternatives, were discussed with and understood by patient.  Patient stated understanding of these and elects to not take the medication at this time.    Adan Sis, ANP 03/04/2009, 8:54 PM    2107: Marye Round PA has assumed the care of Rolla Plate from Adan Sis FNP-BC after throughly discussing patient's condition and plan of care. Marye Round PA will follow up on any ancillary studies and provided any further treatment and management of the patient.      Evaluation Plan:

## 2009-03-04 NOTE — ED Nurses Note (Signed)
Pt presented to ED for blood work following body fluid exposure on 11/5. Pt is employee for [employer]. Pt has no other complaints. Needlestick site has no s/s infection. Pt resting comfortably at this time.

## 2009-03-04 NOTE — Discharge Instructions (Signed)
Needle Stick or Possible Disease Exposure   Any needle stick injury carries a risk of disease. These diseases include Hepatitis B, C and HIV/AIDS and tetanus. Even though the risk is low, the potential for contracting these life-threatening illnesses is very real. The sooner medical treatment is started, the better your chances are for preventing illness. Along with the health concerns, comes the stress and anxiety of worrying about whether or not you have been infected   RISKS    There is less than a one percent chance of getting Human Immunodeficiency Virus (HIV/AIDS) from a needle stick. HIV does not live for very long outside of the body. There is no cure for HIV/AIDS.    There is a thirty percent chance of getting Hepatitis B from a needlestick. The Hepatitis B virus can survive for up to six months outside the body. It can survive on a used needle. A vaccine against Hepatitis B is available. The Hepatitis B vaccine will reduce the risk of Hepatitis B infection from needle stick. There is no cure for Hepatitis B.    There is a four percent chance of getting Hepatitis C from a needle stick. There is no cure for Hepatitis C.    Fortunately, most people are vaccinated against Tetanus. Tetanus (lock-jaw) happens when a wound is contaminated with tetanus spores. A dirty, rusty needle can transmit those spores to a human. This causes spasms and contractions of muscles. These spasms are sometimes referred to as lock-jaw.   RISK OF HIV INFECTION AFTER PERCUTANEOUS EXPOSURE   Researchers have performed studies of healthcare workers who have had occupational exposure to HIV. The studies show that the risk of transmission is about 0.32% Brenton Grills, 1996). This is not as high as the risk for transmission of some other infectious diseases. For example, the risk of transmission following a single exposure to Hepatitis B is as high as 30 percent.   The risk of transmission is increased with exposures involving larger  amounts of blood or a deep injury. The risk of transmission increases if the source of the material is from a patient in the terminal stage of an HIV-related illness. The risk from mucous membrane and non-intact skin exposures are very low. It is too low to be reliably estimated from current studies.   PROPHYLACTIC (PREVENTATIVE) TREATMENT OF HIV EXPOSURE    Zidovudine (ZDV). In December 1995, the Centers for Disease Control (CDC) issued a report about zidovudine. The report suggested taking zidovudine (ZDV, previously called AZT) following occupational HIV exposure reduced the risk of HIV infection by 79 percent. The use of ZDV following occupational HIV exposure is now strongly recommended.    Lamivudine (3TC). In addition to taking ZDV, there are reasons for taking a second drug. This second drug is called Lamivudine (Epivir) (3TC). Taking this drug may help prevent HIV transmission. The addition of 3TC might be useful for two reasons:    It improves the effectiveness of ZDV. This is without any significant increase in side effects.    It reduces the problem of HIV resistance to ZDV.    Indinavir (IDV) and Saquinavir (SQV). A third drug, Indinavir (Crixivan) (IDV), or Saquinavir (Fortovase, SQV) are both protease inhibitors. This drug is often added for people who have had exposures with the highest risk of HIV transmission.   The CDC and the [place] Public Health Service are currently recommending the following treatment as soon as possible after exposure ( less than 2 hours). The treatment may continue for  up to 4 weeks:   Regimen Name Application Drug Regimen   Basic Occupational HIV exposures with a recognized transmission risk. This is based on risk factors identified in the CDC's case-control study (Cardo et al., 1997). 4 weeks (28 days) of both zidovudine and lamivudine.   Zidovudine, 600 mg every day, in divided doses. This means 300 mg, 2 times per day; 200 mg, 3 times per day; or 100 mg every 4 hours.    Lamivudine, 150mg , 2 times per day.   Expanded Occupational HIV exposures that pose an increased risk of transmission. These are exposures of larger volume of blood and/or higher virus titer in blood. This is based on the presence of >2 of the risk factors identified in the case-control study (Cardo et al., 1997). Basic regimen plus either:   Indinavir, 800 mg, every 8 hours, or Nelfinavir, 750 mg, 3 times per day.   RISKS OF TAKING HIV PROPHYLACTIC TREATMENT   These drugs appear to be safe when taken preventively by health care workers following occupational exposure to HIV. Severe toxic reactions have been uncommon. Some risks may be unforeseen. The most common side effects of treatment are:    Mild anemia. .    Lowered white blood cell count. .   Other fairly common adverse effects include:    Fatigue.   Sleep disturbances.   Nausea and vomiting.   Headache.      The following additional symptoms have been reported less frequently:    Fever.   Sweating.   Flank pain.   Shortness of breath.     Malaise.   Hair loss.   Loss of appetite.   Rash.     Muscle aches.   Abdominal pain.   Diarrhea.   Taste abnormalities.       Muscle or nerve inflammation.   Nerve conduction abnormalities.   Inflammation of the liver or pancreas.   Kidney stone formation.       Allergic reactions.   Seizures.      At the present time, Lamivudine (3TC), Indinavir (IDV), and Saquinavir (SQV) are not recommended in pregnancy following occupational HIV exposures.   CONTRACEPTION   All female and female health care workers should use barrier contraception (condoms) for sexual intercourse for three months following an exposure to HIV. Using this reduces the potential for transmission of the virus to a sexual partner. This is for the unlikely event that the exposed person does become infected with HIV. In addition, someone should use barrier contraception during this time period in order to avoid becoming pregnant while  taking anti-HIV treatment. They should also use barrier contraception to avoid becoming pregnant soon after taking the anti-HIV treatment. This will reduce the chance of the drug affecting a pregnancy. If you become pregnant while taking these medications, you should let your caregiver know immediately.   WARNINGS   People who have had life threatening allergic reactions to any of these medications should not take them. Patients who have bone marrow suppression or severely impaired kidney or liver function have an increased risk of serious side effects. Using anti-HIV therapy in combination with certain other drugs can increase drug levels and risk of toxic reactions.   Treatment is voluntary following a possible exposure to an infection. It is best if you make these decisions with complete knowledge of the possible problems or consequences.   These guidelines vary in each state and healthcare facility. It is necessary for you to check on the current  guidelines for their current recommendations in your [place], healthcare facility, and [facility] Department. You may also check with the [employer]. Some of the recommendations are:    The source individual's (the person from where the infection may have come) blood shall be tested as soon as feasible and after getting consent. These blood tests help determine HIV, HBV, and HCV infectivity. If consent is not obtained, it should be recorded that legally required consent could not be obtained. If the source individual is already known to be HIV, HBV, and/or HCV positive, new testing need not be performed.    Results of the source individual's testing will be made available to the exposed employee or person only after getting consent. The exposed employee or person will be informed of applicable laws and regulations concerning disclosure of the identity and infectious status of the source individual.    The exposed employee's blood will be collected as soon  possible. It will be tested after getting consent from the source person. If the employee consents to baseline blood collection, but does not consent at that time for HIV, HBV, and HCV serological testing, the sample shall be preserved for at least 90 days. If the employee decides to have the baseline sample tested within 90 days of the exposure incident, such testing will be done as soon as possible.    For after exposure prophylaxis (prevention), consider following the recommendations established by the U.S. Public Health Service. These guidelines are listed in the Medical Management of Individuals Exposed to Blood/Body Fluids:   l The employee must be made aware of the 2 to 24 hour window of efficacy (effectiveness) of chemical prophylaxis.   l The evaluation must include assessment for Hepatitis C virus.    Counseling is available in most institutions. Usually this is at no cost to employees, people exposed, or to their families. The counseling is usually about the implications of testing and post-exposure prophylaxis.   REDUCING THE RISK OF OCCUPATIONAL HIV & OTHER INFECTIONS   Exposures to diseases are entirely unintentional. It is best to take every precaution to prevent the problems that come with being exposed.    Take extreme precautions with sharps such as needles and scalpels.    Do not recap needles using two hands (use one-handed scoop if recapping is necessary). Investigate new safety devices designed to prevent injuries.    Wear face protection to prevent splashes in the eyes, nose, or mouth.    Protect open cuts or non-intact skin with personal protective equipment.   There is a risk of serious disease with needle stick injuries. It important to develop good habits. These include the most protective and beneficial habits to prevent this.   Most of this information is courtesy of the CDC.   Document Released: 04/16/2005 Document Re-Released: 10/08/2005   [facility] Patient Information 2008  [place], [place].    Needle Stick or Possible Disease Exposure   Any needle stick injury carries a risk of disease. These diseases include Hepatitis B, C and HIV/AIDS and tetanus. Even though the risk is low, the potential for contracting these life-threatening illnesses is very real. The sooner medical treatment is started, the better your chances are for preventing illness. Along with the health concerns, comes the stress and anxiety of worrying about whether or not you have been infected   RISKS    There is less than a one percent chance of getting Human Immunodeficiency Virus (HIV/AIDS) from a needle stick. HIV does  not live for very long outside of the body. There is no cure for HIV/AIDS.    There is a thirty percent chance of getting Hepatitis B from a needlestick. The Hepatitis B virus can survive for up to six months outside the body. It can survive on a used needle. A vaccine against Hepatitis B is available. The Hepatitis B vaccine will reduce the risk of Hepatitis B infection from needle stick. There is no cure for Hepatitis B.    There is a four percent chance of getting Hepatitis C from a needle stick. There is no cure for Hepatitis C.    Fortunately, most people are vaccinated against Tetanus. Tetanus (lock-jaw) happens when a wound is contaminated with tetanus spores. A dirty, rusty needle can transmit those spores to a human. This causes spasms and contractions of muscles. These spasms are sometimes referred to as lock-jaw.   RISK OF HIV INFECTION AFTER PERCUTANEOUS EXPOSURE   Researchers have performed studies of healthcare workers who have had occupational exposure to HIV. The studies show that the risk of transmission is about 0.32% Brenton Grills, 1996). This is not as high as the risk for transmission of some other infectious diseases. For example, the risk of transmission following a single exposure to Hepatitis B is as high as 30 percent.   The risk of transmission is increased with exposures  involving larger amounts of blood or a deep injury. The risk of transmission increases if the source of the material is from a patient in the terminal stage of an HIV-related illness. The risk from mucous membrane and non-intact skin exposures are very low. It is too low to be reliably estimated from current studies.   PROPHYLACTIC (PREVENTATIVE) TREATMENT OF HIV EXPOSURE    Zidovudine (ZDV). In December 1995, the Centers for Disease Control (CDC) issued a report about zidovudine. The report suggested taking zidovudine (ZDV, previously called AZT) following occupational HIV exposure reduced the risk of HIV infection by 79 percent. The use of ZDV following occupational HIV exposure is now strongly recommended.    Lamivudine (3TC). In addition to taking ZDV, there are reasons for taking a second drug. This second drug is called Lamivudine (Epivir) (3TC). Taking this drug may help prevent HIV transmission. The addition of 3TC might be useful for two reasons:    It improves the effectiveness of ZDV. This is without any significant increase in side effects.    It reduces the problem of HIV resistance to ZDV.    Indinavir (IDV) and Saquinavir (SQV). A third drug, Indinavir (Crixivan) (IDV), or Saquinavir (Fortovase, SQV) are both protease inhibitors. This drug is often added for people who have had exposures with the highest risk of HIV transmission.   The CDC and the [place] Public Health Service are currently recommending the following treatment as soon as possible after exposure ( less than 2 hours). The treatment may continue for up to 4 weeks:   Regimen Name Application Drug Regimen   Basic Occupational HIV exposures with a recognized transmission risk. This is based on risk factors identified in the CDC's case-control study (Cardo et al., 1997). 4 weeks (28 days) of both zidovudine and lamivudine.   Zidovudine, 600 mg every day, in divided doses. This means 300 mg, 2 times per day; 200 mg, 3 times per day; or 100 mg  every 4 hours.   Lamivudine, 150mg , 2 times per day.   Expanded Occupational HIV exposures that pose an increased risk of transmission. These are exposures of  larger volume of blood and/or higher virus titer in blood. This is based on the presence of >2 of the risk factors identified in the case-control study (Cardo et al., 1997). Basic regimen plus either:   Indinavir, 800 mg, every 8 hours, or Nelfinavir, 750 mg, 3 times per day.   RISKS OF TAKING HIV PROPHYLACTIC TREATMENT   These drugs appear to be safe when taken preventively by health care workers following occupational exposure to HIV. Severe toxic reactions have been uncommon. Some risks may be unforeseen. The most common side effects of treatment are:    Mild anemia. .    Lowered white blood cell count. .   Other fairly common adverse effects include:    Fatigue.   Sleep disturbances.   Nausea and vomiting.   Headache.      The following additional symptoms have been reported less frequently:    Fever.   Sweating.   Flank pain.   Shortness of breath.     Malaise.   Hair loss.   Loss of appetite.   Rash.     Muscle aches.   Abdominal pain.   Diarrhea.   Taste abnormalities.       Muscle or nerve inflammation.   Nerve conduction abnormalities.   Inflammation of the liver or pancreas.   Kidney stone formation.       Allergic reactions.   Seizures.      At the present time, Lamivudine (3TC), Indinavir (IDV), and Saquinavir (SQV) are not recommended in pregnancy following occupational HIV exposures.   CONTRACEPTION   All female and female health care workers should use barrier contraception (condoms) for sexual intercourse for three months following an exposure to HIV. Using this reduces the potential for transmission of the virus to a sexual partner. This is for the unlikely event that the exposed person does become infected with HIV. In addition, someone should use barrier contraception during this time period in order to avoid becoming  pregnant while taking anti-HIV treatment. They should also use barrier contraception to avoid becoming pregnant soon after taking the anti-HIV treatment. This will reduce the chance of the drug affecting a pregnancy. If you become pregnant while taking these medications, you should let your caregiver know immediately.   WARNINGS   People who have had life threatening allergic reactions to any of these medications should not take them. Patients who have bone marrow suppression or severely impaired kidney or liver function have an increased risk of serious side effects. Using anti-HIV therapy in combination with certain other drugs can increase drug levels and risk of toxic reactions.   Treatment is voluntary following a possible exposure to an infection. It is best if you make these decisions with complete knowledge of the possible problems or consequences.   These guidelines vary in each state and healthcare facility. It is necessary for you to check on the current guidelines for their current recommendations in your [place], healthcare facility, and [facility] [place] Department. You may also check with the [employer]. Some of the recommendations are:    The source individual's (the person from where the infection may have come) blood shall be tested as soon as feasible and after getting consent. These blood tests help determine HIV, HBV, and HCV infectivity. If consent is not obtained, it should be recorded that legally required consent could not be obtained. If the source individual is already known to be HIV, HBV, and/or HCV positive, new testing need not be performed.  Results of the source individual's testing will be made available to the exposed employee or person only after getting consent. The exposed employee or person will be informed of applicable laws and regulations concerning disclosure of the identity and infectious status of the source individual.    The exposed employee's blood will be  collected as soon possible. It will be tested after getting consent from the source person. If the employee consents to baseline blood collection, but does not consent at that time for HIV, HBV, and HCV serological testing, the sample shall be preserved for at least 90 days. If the employee decides to have the baseline sample tested within 90 days of the exposure incident, such testing will be done as soon as possible.    For after exposure prophylaxis (prevention), consider following the recommendations established by the U.S. Public Health Service. These guidelines are listed in the Medical Management of Individuals Exposed to Blood/Body Fluids:   l The employee must be made aware of the 2 to 24 hour window of efficacy (effectiveness) of chemical prophylaxis.   l The evaluation must include assessment for Hepatitis C virus.    Counseling is available in most institutions. Usually this is at no cost to employees, people exposed, or to their families. The counseling is usually about the implications of testing and post-exposure prophylaxis.   REDUCING THE RISK OF OCCUPATIONAL HIV & OTHER INFECTIONS   Exposures to diseases are entirely unintentional. It is best to take every precaution to prevent the problems that come with being exposed.    Take extreme precautions with sharps such as needles and scalpels.    Do not recap needles using two hands (use one-handed scoop if recapping is necessary). Investigate new safety devices designed to prevent injuries.    Wear face protection to prevent splashes in the eyes, nose, or mouth.    Protect open cuts or non-intact skin with personal protective equipment.   There is a risk of serious disease with needle stick injuries. It important to develop good habits. These include the most protective and beneficial habits to prevent this.   Most of this information is courtesy of the CDC.   Document Released: 04/16/2005 Document Re-Released: 10/08/2005   [facility] Patient  Information 2008 [place], [place].

## 2009-03-07 LAB — HEPATITIS B CORE IGM, AB: HEPATITIS B CORE AB, IGM: NEGATIVE

## 2009-03-07 LAB — HEPATITIS B CORE ANTIBODY: AB TO HEP. B CORE AG: NEGATIVE

## 2009-03-07 LAB — HEPATITIS B SURFACE ANTIGEN: HEPATITIS B SURFACE AG: NEGATIVE

## 2009-03-07 LAB — HEPATITIS C ANTIBODY SCREEN WITH REFLEX TO HCV PCR: HEPATITIS C ANTIBODY: NEGATIVE

## 2011-08-15 ENCOUNTER — Ambulatory Visit (INDEPENDENT_AMBULATORY_CARE_PROVIDER_SITE_OTHER): Payer: Self-pay | Admitting: Obstetrics-Cheat Lake

## 2011-09-04 ENCOUNTER — Ambulatory Visit (HOSPITAL_BASED_OUTPATIENT_CLINIC_OR_DEPARTMENT_OTHER): Payer: BC Managed Care – PPO | Admitting: Hospital-Specialty Hospital

## 2011-09-04 ENCOUNTER — Ambulatory Visit
Admission: RE | Admit: 2011-09-04 | Discharge: 2011-09-04 | Disposition: A | Discharge status: Home / Self Care | Payer: BC Managed Care – PPO | Source: Ambulatory Visit | Attending: Hospital-Specialty Hospital | Admitting: Hospital-Specialty Hospital

## 2011-09-04 ENCOUNTER — Encounter (HOSPITAL_BASED_OUTPATIENT_CLINIC_OR_DEPARTMENT_OTHER): Payer: Self-pay | Admitting: Hospital-Specialty Hospital

## 2011-09-04 ENCOUNTER — Ambulatory Visit (HOSPITAL_BASED_OUTPATIENT_CLINIC_OR_DEPARTMENT_OTHER)
Admission: RE | Admit: 2011-09-04 | Discharge: 2011-09-04 | Disposition: A | Discharge status: Home / Self Care | Payer: BC Managed Care – PPO | Source: Ambulatory Visit | Admitting: Gynecology

## 2011-09-04 VITALS — BP 106/64 | Ht 60.0 in | Wt 130.7 lb

## 2011-09-04 DIAGNOSIS — O200 Threatened abortion: Secondary | ICD-10-CM | POA: Insufficient documentation

## 2011-09-04 DIAGNOSIS — Z8759 Personal history of other complications of pregnancy, childbirth and the puerperium: Secondary | ICD-10-CM | POA: Insufficient documentation

## 2011-09-04 DIAGNOSIS — O039 Complete or unspecified spontaneous abortion without complication: Secondary | ICD-10-CM

## 2011-09-04 HISTORY — DX: Complete or unspecified spontaneous abortion without complication: O03.9

## 2011-09-04 LAB — HCG, PLASMA OR SERUM QUANTITATIVE, PREGNANCY: HCG QUANTITATIVE/PREG: 29248 m[IU]/mL — ABNORMAL HIGH (ref <5)

## 2011-09-04 NOTE — Progress Notes (Signed)
Patient comes for a second opinion after being seen at [facility] health care. Last menstrual period 06/27/11 with a history of previous ectopic pregnancy the right tube had a cesarean section with a previous intrauterine pregnancy. Ultrasound at their facility revealed absence of fetal heart tones with a 5 .6 week embryo on 08/23/11. HCG level was 52,000. She relates no bleeding or cramping.    Afebrile; vital signs stable  Abdomen soft nontender without masses or organomegaly  Pelvic exam  External within normal limits  Vagina without blood or discharge  Cervix normal  Uterus minimally enlarged  Adnexa without masses or tenderness  Ultrasound reveals a 6 week embryo with absence of fetal heart sounds. HCG levels today are 29,248.    1. Threatened miscarriage (640.00)  US FETAL ASSESSMENT, HCG, SERUM QUANTITATIVE, PREGNANCY   2. Embryo/fetus death (779.9)     3. S/P ectopic pregnancy (V13.29)       Discussed Cytotec, suction D&E, or watchful waiting. Patient will call to advise [place] how she wishes to proceed.

## 2011-09-05 ENCOUNTER — Ambulatory Visit (HOSPITAL_BASED_OUTPATIENT_CLINIC_OR_DEPARTMENT_OTHER): Payer: Self-pay | Admitting: Hospital-Specialty Hospital

## 2011-09-05 ENCOUNTER — Other Ambulatory Visit (HOSPITAL_BASED_OUTPATIENT_CLINIC_OR_DEPARTMENT_OTHER): Payer: Self-pay | Admitting: Hospital-Specialty Hospital

## 2011-09-05 NOTE — Telephone Encounter (Signed)
Pt called requesting results from u/s and blood work yesterday. Please review and advise, thanks!

## 2011-09-05 NOTE — Telephone Encounter (Signed)
Pt called stating she would like the Cytotec scripted to her pharmacy. Pt last seen 09/04/11.

## 2011-09-06 ENCOUNTER — Ambulatory Visit (HOSPITAL_BASED_OUTPATIENT_CLINIC_OR_DEPARTMENT_OTHER): Payer: Self-pay | Admitting: Hospital-Specialty Hospital

## 2011-09-06 MED ORDER — MISOPROSTOL 200 MCG TABLET
200.00 ug | ORAL_TABLET | Freq: Four times a day (QID) | ORAL | Status: DC
Start: 2011-09-05 — End: 2011-10-05

## 2011-09-06 NOTE — Telephone Encounter (Signed)
Pt called stating she picked up her prescription for cytotec but would also like something for pain. Pt stated she is unable to take NSAIDS due to allergy. Pt stated she can take tylenol containing products. Please advise.

## 2011-09-07 ENCOUNTER — Other Ambulatory Visit (INDEPENDENT_AMBULATORY_CARE_PROVIDER_SITE_OTHER): Payer: Self-pay | Admitting: Obstetrics & Gynecology

## 2011-09-07 ENCOUNTER — Ambulatory Visit (INDEPENDENT_AMBULATORY_CARE_PROVIDER_SITE_OTHER): Payer: Self-pay | Admitting: Obstetrics & Gynecology

## 2011-09-07 MED ORDER — HYDROCODONE 5 MG-ACETAMINOPHEN 325 MG TABLET
1.00 | ORAL_TABLET | ORAL | Status: DC | PRN
Start: 2011-09-07 — End: 2011-10-05

## 2011-09-07 MED ORDER — ONDANSETRON HCL 4 MG TABLET
4.00 mg | ORAL_TABLET | Freq: Three times a day (TID) | ORAL | Status: DC | PRN
Start: 2011-09-07 — End: 2011-10-05

## 2011-09-07 NOTE — Telephone Encounter (Signed)
Message copied by Trisha Mangle on Fri Sep 07, 2011  5:54 PM  ------       Message from: Stanfield, Cherylann Ratel       Created: Fri Sep 07, 2011  5:28 PM         >> Anne Cummings 09/07/2011 05:28 PM       Dr. Albin Fischer,              Patient taking misoprostol (CYTOTEC) 200 mcg Tab and is very uncomfortable. She would like an anti-nausea medication or pain medication called in. Connected to advise. CStraight.

## 2011-09-07 NOTE — Telephone Encounter (Signed)
Patient calling for something for nausea and cramping due to cytotec. 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 Patient reports cramping and some bleeding. She denies heavy bleeding. Norco 5/325 (#5) and Zofran sent to her pharmacy. Patient advised to call or report to ED with severe cramping or bleeding. She voiced understanding.    Mallary Kreger El-Amin, DO 09/07/2011, 5:57 PM

## 2011-09-08 ENCOUNTER — Telehealth (INDEPENDENT_AMBULATORY_CARE_PROVIDER_SITE_OTHER): Payer: Self-pay | Admitting: Obstetrics & Gynecology

## 2011-09-08 NOTE — Telephone Encounter (Signed)
Myleigh Amara  [id number]  09/08/2011    Patient received Cytotec q4h x4 doses, took first dose Friday at 00:00, did not take the 5th dose because she was already bleeding, cramping, N/V/D heavily.  Reports passing "golf ball to tennis ball" sized clots.  Took Zofran and Norco.  This seemed to slow around 18:30 along with the N/V/D and she started to eat yesterday a bit.  After many hours, she felt nauseated again and is worried her bleeding is coming back and she feels fever/chills.  Passed a "few more small clots here and there" this morning.  Most concerned about her cramping.    Was initially seen at [facility] on 4/25 with absent cardiac activity at [redacted]w[redacted]d with hCG of 52,000 and seen for 2nd opinion on 5/7 with hCG of 29,000.  Patient called back in to ask for Cytotec.      Advised patient that this may be all normal side effects of the Cytotec and that her bleeding should continue to slow.  If her pain is out of control or the bleeding does pick back up to soaking through >1pad/h, she should come in.  She should avoid eating if she plans to come in in the event of surgery being necessary. Patient expressed understanding.    Toya Smothers, MD 09/08/2011, 8:08 AM

## 2011-09-10 ENCOUNTER — Other Ambulatory Visit (INDEPENDENT_AMBULATORY_CARE_PROVIDER_SITE_OTHER): Payer: Self-pay | Admitting: Hospital-Specialty Hospital

## 2011-09-11 ENCOUNTER — Ambulatory Visit (HOSPITAL_BASED_OUTPATIENT_CLINIC_OR_DEPARTMENT_OTHER)
Admission: RE | Admit: 2011-09-11 | Discharge: 2011-09-11 | Disposition: A | Discharge status: Home / Self Care | Payer: BC Managed Care – PPO | Source: Ambulatory Visit | Admitting: Gynecology

## 2011-09-11 ENCOUNTER — Ambulatory Visit (HOSPITAL_BASED_OUTPATIENT_CLINIC_OR_DEPARTMENT_OTHER): Payer: Self-pay | Admitting: Hospital-Specialty Hospital

## 2011-09-11 ENCOUNTER — Ambulatory Visit
Admission: RE | Admit: 2011-09-11 | Discharge: 2011-09-11 | Disposition: A | Discharge status: Home / Self Care | Payer: BC Managed Care – PPO | Source: Ambulatory Visit | Attending: Hospital-Specialty Hospital | Admitting: Hospital-Specialty Hospital

## 2011-09-11 DIAGNOSIS — O039 Complete or unspecified spontaneous abortion without complication: Secondary | ICD-10-CM | POA: Insufficient documentation

## 2011-09-11 LAB — HCG, PLASMA OR SERUM QUANTITATIVE, PREGNANCY: HCG QUANTITATIVE/PREG: 2052 m[IU]/mL — ABNORMAL HIGH (ref <5)

## 2011-09-12 ENCOUNTER — Ambulatory Visit (HOSPITAL_BASED_OUTPATIENT_CLINIC_OR_DEPARTMENT_OTHER): Payer: Self-pay | Admitting: Hospital-Specialty Hospital

## 2011-09-12 NOTE — Telephone Encounter (Signed)
Pt calling requesting results from u/s 09/11/11. Please review and advise, thanks!

## 2011-09-12 NOTE — Telephone Encounter (Signed)
Per Dr Elwyn Reach, advised pt her u/s looked reassuring and that her HCG level is falling appropriately. Advised pt to come in Monday to have repeat HCG level drawn.  Pt requested to schedule a follow up with Dr Elwyn Reach to discuss the miscarriage and future conception. Scheduled pt for 09/18/11 at 2:30pm.

## 2011-09-18 ENCOUNTER — Ambulatory Visit (HOSPITAL_BASED_OUTPATIENT_CLINIC_OR_DEPARTMENT_OTHER): Payer: Self-pay | Admitting: Hospital-Specialty Hospital

## 2011-10-05 ENCOUNTER — Encounter (HOSPITAL_BASED_OUTPATIENT_CLINIC_OR_DEPARTMENT_OTHER): Payer: Self-pay | Admitting: Hospital-Specialty Hospital

## 2011-10-05 ENCOUNTER — Ambulatory Visit (HOSPITAL_BASED_OUTPATIENT_CLINIC_OR_DEPARTMENT_OTHER): Payer: BC Managed Care – PPO | Admitting: Hospital-Specialty Hospital

## 2011-10-05 ENCOUNTER — Ambulatory Visit
Admission: RE | Admit: 2011-10-05 | Discharge: 2011-10-05 | Disposition: A | Discharge status: Home / Self Care | Payer: BC Managed Care – PPO | Source: Ambulatory Visit | Attending: Hospital-Specialty Hospital | Admitting: Hospital-Specialty Hospital

## 2011-10-05 VITALS — BP 96/60 | Wt 123.9 lb

## 2011-10-05 DIAGNOSIS — B9689 Other specified bacterial agents as the cause of diseases classified elsewhere: Secondary | ICD-10-CM

## 2011-10-05 DIAGNOSIS — N760 Acute vaginitis: Secondary | ICD-10-CM | POA: Insufficient documentation

## 2011-10-05 DIAGNOSIS — O200 Threatened abortion: Secondary | ICD-10-CM

## 2011-10-05 LAB — HCG, PLASMA OR SERUM QUANTITATIVE, PREGNANCY: HCG QUANTITATIVE/PREG: 57 m[IU]/mL — ABNORMAL HIGH (ref <5)

## 2011-10-05 MED ORDER — METRONIDAZOLE 500 MG TABLET
500.00 mg | ORAL_TABLET | Freq: Two times a day (BID) | ORAL | Status: DC
Start: 2011-10-05 — End: 2012-03-11

## 2011-10-05 NOTE — Progress Notes (Signed)
Patient miscarried approximately 3 and a half weeks ago. Confirmed by ultrasound. HCG levels were falling appropriately. Now has vaginal discharge that is malodorous and pink. No cramps. Has not had final hCG  to track levels to 0.  Vital signs stable; afebrile; pulse 80; respirations 20  Abdomen-soft nontender without masses organomegaly, bowel sounds normal  Pelvic exam  External genitalia normal  Vagina small amount of pink discharge; positive whiff test  Cervix normal appearance, closed  Uterus normal size shape and consistency, mid plane to slightly anterior  Adnexa without masses or tenderness    1. Threatened miscarriage (640.00)  HCG, SERUM QUANTITATIVE, PREGNANCY   2. Bacterial vaginosis (616.10)  metroNIDAZOLE (FLAGYL) 500 mg Oral Tablet     Patient now has a completed miscarriage. Repeat hCG level. Flagyl for BV.  Once hCG levels are 0, patient was encouraged to have 2 normal periods prior to attempting the next pregnancy. She will take folic acid daily.  She will call after her first missed period.

## 2011-10-09 ENCOUNTER — Ambulatory Visit (HOSPITAL_BASED_OUTPATIENT_CLINIC_OR_DEPARTMENT_OTHER): Payer: BC Managed Care – PPO | Admitting: Hospital-Specialty Hospital

## 2011-10-12 ENCOUNTER — Ambulatory Visit (HOSPITAL_BASED_OUTPATIENT_CLINIC_OR_DEPARTMENT_OTHER): Payer: Self-pay | Admitting: Hospital-Specialty Hospital

## 2011-10-12 ENCOUNTER — Other Ambulatory Visit (INDEPENDENT_AMBULATORY_CARE_PROVIDER_SITE_OTHER): Payer: Self-pay | Admitting: Hospital-Specialty Hospital

## 2011-10-12 NOTE — Telephone Encounter (Signed)
 Pt calling for results from HCG on 10/05/11. Advised pt it had come down to 57. Pt stated she is still having irregular bleeding. Please advise.

## 2011-10-26 ENCOUNTER — Ambulatory Visit (HOSPITAL_BASED_OUTPATIENT_CLINIC_OR_DEPARTMENT_OTHER): Payer: Self-pay | Admitting: Hospital-Specialty Hospital

## 2012-03-11 ENCOUNTER — Ambulatory Visit (HOSPITAL_BASED_OUTPATIENT_CLINIC_OR_DEPARTMENT_OTHER): Payer: BC Managed Care – PPO | Admitting: Gynecology

## 2012-03-11 ENCOUNTER — Ambulatory Visit
Admission: RE | Admit: 2012-03-11 | Discharge: 2012-03-11 | Disposition: A | Discharge status: Home / Self Care | Payer: BC Managed Care – PPO | Source: Ambulatory Visit | Attending: Hospital-Specialty Hospital | Admitting: Hospital-Specialty Hospital

## 2012-03-11 ENCOUNTER — Other Ambulatory Visit (HOSPITAL_BASED_OUTPATIENT_CLINIC_OR_DEPARTMENT_OTHER): Payer: Self-pay | Admitting: Hospital-Specialty Hospital

## 2012-03-11 ENCOUNTER — Ambulatory Visit (HOSPITAL_BASED_OUTPATIENT_CLINIC_OR_DEPARTMENT_OTHER): Payer: BC Managed Care – PPO | Admitting: Hospital-Specialty Hospital

## 2012-03-11 ENCOUNTER — Encounter (HOSPITAL_BASED_OUTPATIENT_CLINIC_OR_DEPARTMENT_OTHER): Payer: Self-pay | Admitting: Hospital-Specialty Hospital

## 2012-03-11 VITALS — BP 100/62 | Ht 60.0 in | Wt 129.6 lb

## 2012-03-11 DIAGNOSIS — Z1151 Encounter for screening for human papillomavirus (HPV): Secondary | ICD-10-CM | POA: Insufficient documentation

## 2012-03-11 DIAGNOSIS — O9989 Other specified diseases and conditions complicating pregnancy, childbirth and the puerperium: Secondary | ICD-10-CM | POA: Insufficient documentation

## 2012-03-11 DIAGNOSIS — Z3480 Encounter for supervision of other normal pregnancy, unspecified trimester: Secondary | ICD-10-CM | POA: Insufficient documentation

## 2012-03-11 DIAGNOSIS — Z124 Encounter for screening for malignant neoplasm of cervix: Secondary | ICD-10-CM | POA: Insufficient documentation

## 2012-03-11 LAB — THYROID STIMULATING HORMONE WITH FREE T4 REFLEX: THYROID STIMULATING HORMONE WITH FREE T4 REFLEX: 1.438 u[IU]/mL (ref 0.300–5.900)

## 2012-03-11 NOTE — Progress Notes (Signed)
Patient relates regular cyclic menses. Currently, trying to conceive. Status post miscarriage this past spring.also has history of ectopic pregnancy. Has been in good health. Taking prenatal vitamins.states she has been fatigued recently. Family history of thyroid disease.  Vital signs stable; afebrile  Abdomen-soft, nontender, without masses, or organomegaly  Pelvic exam  External genitalia without lesions  Vagina without blood or discharge  Cervix-normal appearance  Uterus normal size, shape, and consistency, mid plane  Adnexa without masses or tenderness      1. Supervision of normal pregnancy (V22.1)     2. Fatigue (780.79)  THYROID STIMULATING HORMONE WITH FREE T4 REFLEX   3. Screening for malignant neoplasm of the cervix (V76.2)  CYTOPATHOLOGY-GYN (PAP AND HPV TESTS)     follow up as per lab results.

## 2012-03-12 LAB — HISTORICAL CYTOPATHOLOGY-GYN (PAP AND HPV TESTS)

## 2012-04-14 ENCOUNTER — Encounter (HOSPITAL_BASED_OUTPATIENT_CLINIC_OR_DEPARTMENT_OTHER): Payer: Self-pay

## 2012-04-16 ENCOUNTER — Encounter: Payer: Self-pay | Admitting: FAMILY PRACTICE

## 2012-04-29 ENCOUNTER — Encounter (INDEPENDENT_AMBULATORY_CARE_PROVIDER_SITE_OTHER): Payer: Self-pay

## 2012-06-23 ENCOUNTER — Encounter (HOSPITAL_BASED_OUTPATIENT_CLINIC_OR_DEPARTMENT_OTHER): Payer: Self-pay

## 2012-06-23 ENCOUNTER — Ambulatory Visit (HOSPITAL_BASED_OUTPATIENT_CLINIC_OR_DEPARTMENT_OTHER): Payer: BC Managed Care – PPO

## 2012-06-23 ENCOUNTER — Ambulatory Visit
Admission: RE | Admit: 2012-06-23 | Discharge: 2012-06-23 | Disposition: A | Discharge status: Home / Self Care | Payer: BC Managed Care – PPO | Source: Ambulatory Visit

## 2012-06-23 VITALS — BP 108/72 | Ht 60.0 in | Wt 135.6 lb

## 2012-06-23 DIAGNOSIS — O039 Complete or unspecified spontaneous abortion without complication: Secondary | ICD-10-CM | POA: Insufficient documentation

## 2012-06-23 DIAGNOSIS — N920 Excessive and frequent menstruation with regular cycle: Secondary | ICD-10-CM | POA: Insufficient documentation

## 2012-06-23 DIAGNOSIS — Z308 Encounter for other contraceptive management: Secondary | ICD-10-CM | POA: Insufficient documentation

## 2012-06-23 DIAGNOSIS — Z309 Encounter for contraceptive management, unspecified: Secondary | ICD-10-CM

## 2012-06-23 DIAGNOSIS — F17200 Nicotine dependence, unspecified, uncomplicated: Secondary | ICD-10-CM | POA: Insufficient documentation

## 2012-06-23 HISTORY — DX: Excessive and frequent menstruation with regular cycle: N92.0

## 2012-06-23 LAB — CBC
HCT: 38.1 % (ref 33.5–45.2)
HGB: 12.4 g/dL (ref 11.2–15.2)
MCH: 27.8 pg (ref 27.4–33.0)
MCHC: 32.4 g/dL — ABNORMAL LOW (ref 32.5–35.8)
MCV: 85.6 fL (ref 78–100)
MPV: 9.4 fL (ref 7.5–11.5)
PLATELET COUNT: 278 THOU/uL (ref 140–450)
RBC: 4.45 MIL/uL (ref 3.63–4.92)
RDW: 14 % (ref 12.0–15.0)
WBC: 8.5 THOU/uL (ref 3.5–11.0)

## 2012-06-23 MED ORDER — NORGESTIMATE-ETHINYL ESTRADIOL 0.18MG/0.215MG/0.25MG-0.035MG(28)TABLET
1.0000 | ORAL_TABLET | Freq: Every day | ORAL | Status: DC
Start: 2012-06-23 — End: 2012-11-25

## 2012-06-23 NOTE — Progress Notes (Addendum)
Subjective:     Patient ID:  Anne Cummings is an 27 y.o. female   Chief Complaint:    Chief Complaint   Patient presents with   . Contraception   . Heavy Bleeding       HPI  Former Dr. Elwyn Reach patient    27 yo G3 P1021 Single with complaints of irregular heavy menses.   Miscarriage 08/2011; ectopic pregnancy with salpingectomy 2010.   Menses:  Every 28 days/5-6 days (longer)/ heavy flow - using tampons every 2 hours.   Contraception:  none  Condoms:  none  Last TSH  02/2012 = 1.438  + Smoker:  5 cigs/day x 3 years.     Past BCM:  Ortho TriCyclen OCP's  - helped her acne.   Hx of anemia.     Past Medical History   Diagnosis Date   . Hernia of unspecified site of abdominal cavity without mention of obstruction or gangrene      r/t pregnancy   . Asthma    . Headache    . Heart disease    . HTN    . Infertility female    . Kidney disease    . Hx of ectopic pregnancy 2010   . Miscarriage 09/04/2011      Past Surgical History   Procedure Laterality Date   . Hx cesarean section     . Hx salpingectomy  2010     Ectopic Pregnancy       Outpatient Prescriptions Prior to Visit:  valACYclovir (VALTREX) 500 mg Oral Tablet Take 500 mg by mouth Twice daily   prenatal vitamin-iron-folate (NATALCARE PLUS) 27-0.8 mg Oral Tablet Take 1 Tab by mouth Once a day     No facility-administered medications prior to visit.  Allergies   Allergen Reactions   . Naproxen Anaphylaxis   . Nsaids (Non-Steroidal Anti-Inflammatory Drug) Anaphylaxis   . Penicillins Rash     "fever blisters in & around mouth"         Review of Systems   Genitourinary:        Heavy menses - wants contraception to regulate.        Objective:   Physical Exam   Constitutional: She appears well-developed and well-nourished. No distress.   GU:    External Exam:    External exam normal (declines pelvic - on menses).      Neurological: She is alert.   Psychiatric: She has a normal mood and affect. Her behavior is normal. Judgment and thought content normal.     .   BP 108/72   Ht 1.524 m (5')   Wt 61.5 kg (135 lb 9.3 oz)   BMI 26.48 kg/m2   LMP 06/23/2012   Breastfeeding? Unknown    Assessment & Plan:     1. Menorrhagia (626.2)  CBC, Norgestimate-Ethinyl Estradiol (equiv to: ORTHO TRI-CYCLEN) 0.18/0.215/0.25 mg-35 mcg (28) Oral Tab   2. Contraception management (V25.9)  Norgestimate-Ethinyl Estradiol (equiv to: ORTHO TRI-CYCLEN) 0.18/0.215/0.25 mg-35 mcg (28) Oral Tab     Discussed all BCM options - elects OCP - Ortho TriCyclen  RX - start today.   RTN 3 months for medication check.    Carman Ching, NP 06/23/2012, 10:14 PM  Patient seen independently with co-signing physician present in clinic.  I did not participate in this patient's care and was not consulted, but was present in the clinic the day of the visit.  Edwyna Perfect, MD

## 2012-09-25 ENCOUNTER — Ambulatory Visit (HOSPITAL_BASED_OUTPATIENT_CLINIC_OR_DEPARTMENT_OTHER): Payer: BC Managed Care – PPO

## 2012-10-27 ENCOUNTER — Ambulatory Visit (HOSPITAL_BASED_OUTPATIENT_CLINIC_OR_DEPARTMENT_OTHER): Payer: BC Managed Care – PPO | Admitting: Advanced Practice Midwife

## 2012-11-25 ENCOUNTER — Other Ambulatory Visit (HOSPITAL_BASED_OUTPATIENT_CLINIC_OR_DEPARTMENT_OTHER): Payer: Self-pay

## 2012-11-25 MED ORDER — NORGESTIMATE-ETHINYL ESTRADIOL 0.18MG/0.215MG/0.25MG-0.035MG(28)TABLET
1.0000 | ORAL_TABLET | Freq: Every day | ORAL | Status: DC
Start: 2012-11-25 — End: 2012-12-25

## 2012-11-25 NOTE — Telephone Encounter (Signed)
 Pt has pnd appt 12/25/12 and is requesting refill of her ocps. Please escript to mail order pharmacy. Burnard Idell Che, LPN 2/70/7985, 89:89 AM

## 2012-11-25 NOTE — Telephone Encounter (Signed)
Pt has pnd appt 12/25/12 and is requesting refill of her ocps. Please escript to mail order pharmacy. Lang Snow, LPN 1/61/0960, 45:40 AM    Approved Ortho Diona Fanti, please notify patient. Carman Ching, NP 11/25/2012, 2:37 PM

## 2012-12-25 ENCOUNTER — Ambulatory Visit: Payer: BC Managed Care – PPO

## 2012-12-25 ENCOUNTER — Encounter (HOSPITAL_BASED_OUTPATIENT_CLINIC_OR_DEPARTMENT_OTHER): Payer: Self-pay

## 2012-12-25 VITALS — BP 110/68 | Ht 60.0 in | Wt 137.1 lb

## 2012-12-25 DIAGNOSIS — L708 Other acne: Secondary | ICD-10-CM | POA: Insufficient documentation

## 2012-12-25 DIAGNOSIS — Z309 Encounter for contraceptive management, unspecified: Secondary | ICD-10-CM

## 2012-12-25 DIAGNOSIS — N920 Excessive and frequent menstruation with regular cycle: Secondary | ICD-10-CM

## 2012-12-25 DIAGNOSIS — Z3041 Encounter for surveillance of contraceptive pills: Secondary | ICD-10-CM | POA: Insufficient documentation

## 2012-12-25 DIAGNOSIS — F17200 Nicotine dependence, unspecified, uncomplicated: Secondary | ICD-10-CM | POA: Insufficient documentation

## 2012-12-25 MED ORDER — NORGESTIMATE-ETHINYL ESTRADIOL 0.18MG/0.215MG/0.25MG-0.035MG(28)TABLET
1.0000 | ORAL_TABLET | Freq: Every day | ORAL | Status: DC
Start: 2012-12-25 — End: 2013-06-25

## 2012-12-25 NOTE — Progress Notes (Addendum)
For Medication Check Visit:  Jeri Lager   Last seen:  06/23/2012     27 yo G3P1021 who has been on 6 cycles of Ortho Tri-Cyclen OCP.  Indication:  Menorrhagia, contraception, acne  Menses:  Monthly/4 days/light to moderate flow now  Acne: slightly improved.   IMB:  none  Missed Pills:  none  Side Effects:  None  Smoking:  < 5 cigs/day x 3 years    Miscarriage 08/2011; ectopic pregnancy with salpingectomy 2010.       Exam:  BP 110/68   Ht 1.524 m (5')   Wt 62.2 kg (137 lb 2 oz)   BMI 26.78 kg/m2   LMP 12/06/2012   Breastfeeding? No      ICD-9-CM    1. Contraception management V25.9 Norgestimate-Ethinyl Estradiol (equiv to: ORTHO TRI-CYCLEN) 0.18/0.215/0.25 mg-35 mcg (28) Oral Tab   2. Menorrhagia 626.2 Norgestimate-Ethinyl Estradiol (equiv to: ORTHO TRI-CYCLEN) 0.18/0.215/0.25 mg-35 mcg (28) Oral Tab       Plan:  Continue Ortho Tri-Cyclen RX OCP  Aggressive smoking cessation counseling - NR handouts including handout on physical effects after quitting.   RTN Annual GYN on 06/23/2013    Carman Ching, NP 12/25/2012, 9:04 AM  Patient seen independently - no Faculty in clinic

## 2013-02-21 ENCOUNTER — Other Ambulatory Visit: Payer: Self-pay

## 2013-02-23 ENCOUNTER — Other Ambulatory Visit: Payer: Self-pay

## 2013-04-30 LAB — HM PAP SMEAR: HM Pap smear: NORMAL

## 2013-06-25 ENCOUNTER — Encounter (HOSPITAL_BASED_OUTPATIENT_CLINIC_OR_DEPARTMENT_OTHER): Payer: Self-pay

## 2013-06-25 ENCOUNTER — Ambulatory Visit (HOSPITAL_BASED_OUTPATIENT_CLINIC_OR_DEPARTMENT_OTHER): Payer: BC Managed Care – PPO

## 2013-06-25 ENCOUNTER — Ambulatory Visit
Admission: RE | Admit: 2013-06-25 | Discharge: 2013-06-25 | Disposition: A | Discharge status: Home / Self Care | Payer: BC Managed Care – PPO | Source: Ambulatory Visit | Attending: Obstetrics & Gynecology | Admitting: Obstetrics & Gynecology

## 2013-06-25 VITALS — BP 108/72 | Ht 60.0 in | Wt 157.4 lb

## 2013-06-25 DIAGNOSIS — B009 Herpesviral infection, unspecified: Secondary | ICD-10-CM

## 2013-06-25 DIAGNOSIS — Z3041 Encounter for surveillance of contraceptive pills: Secondary | ICD-10-CM | POA: Insufficient documentation

## 2013-06-25 DIAGNOSIS — Z309 Encounter for contraceptive management, unspecified: Secondary | ICD-10-CM

## 2013-06-25 DIAGNOSIS — Z9079 Acquired absence of other genital organ(s): Secondary | ICD-10-CM | POA: Insufficient documentation

## 2013-06-25 DIAGNOSIS — Z01419 Encounter for gynecological examination (general) (routine) without abnormal findings: Secondary | ICD-10-CM

## 2013-06-25 DIAGNOSIS — N920 Excessive and frequent menstruation with regular cycle: Secondary | ICD-10-CM | POA: Insufficient documentation

## 2013-06-25 DIAGNOSIS — I10 Essential (primary) hypertension: Secondary | ICD-10-CM | POA: Insufficient documentation

## 2013-06-25 DIAGNOSIS — Z8619 Personal history of other infectious and parasitic diseases: Secondary | ICD-10-CM

## 2013-06-25 DIAGNOSIS — B001 Herpesviral vesicular dermatitis: Secondary | ICD-10-CM

## 2013-06-25 DIAGNOSIS — Z803 Family history of malignant neoplasm of breast: Secondary | ICD-10-CM | POA: Insufficient documentation

## 2013-06-25 DIAGNOSIS — Z113 Encounter for screening for infections with a predominantly sexual mode of transmission: Secondary | ICD-10-CM

## 2013-06-25 DIAGNOSIS — F17200 Nicotine dependence, unspecified, uncomplicated: Secondary | ICD-10-CM | POA: Insufficient documentation

## 2013-06-25 DIAGNOSIS — N6019 Diffuse cystic mastopathy of unspecified breast: Secondary | ICD-10-CM | POA: Insufficient documentation

## 2013-06-25 HISTORY — DX: Herpesviral vesicular dermatitis: B00.1

## 2013-06-25 MED ORDER — NORGESTIMATE-ETHINYL ESTRADIOL 0.18MG/0.215MG/0.25MG-0.035MG(28)TABLET
1.0000 | ORAL_TABLET | Freq: Every day | ORAL | Status: DC
Start: 2013-06-25 — End: 2014-05-14

## 2013-06-25 MED ORDER — VALACYCLOVIR 500 MG TABLET
500.0000 mg | ORAL_TABLET | Freq: Every day | ORAL | Status: DC
Start: 2013-06-25 — End: 2014-02-11

## 2013-06-25 NOTE — Progress Notes (Addendum)
Subjective:     Patient ID:  Anne Cummings is an 28 y.o. female   Chief Complaint:    Chief Complaint   Patient presents with   . Gyn Exam   . Medication Refill       HPI  Return Patient  + Miscarriage 08/2011; Ectopic pregnancy with salpingectomy 2010.   + Hx of anemia.     28 yo G3 P1021  Partner status:  Single with no partner at present/last partner 9 mos ago  Last Pap:   03/11/2012 Negative with HR Negative  Abnormal Pap:  none  Last STI Screen: ?    Contraception: Currently on Ortho Tricyclen OCP's for acne/menorrhagia/contraception - re-started 06/23/2012   Menses: monthly/ 4 days/ light to moderate flow  IMB:  none  PCB:  n/a  Smoking:  < 5 cigs/day x 6 years.       HML:  none  Exercise:  x2/week  Calcium:  X1-2 ser/day; no supplements  Caffeine:  Coffee x 3-4 ser/day  Vaccines:  Flu Vaccine 2014 - No/declines - works in [place].     GYN Concerns:  "Needs refills of OCP's and Valtrex RX - takes for herpes labialis reasons.     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     History     Social History   . Marital Status: Single     Spouse Name: N/A     Number of Children: N/A   . Years of Education: N/A     Occupational History   . RN [employer]     Social History Main Topics   . Smoking status: Current Every Day Smoker -- 0.50 packs/day for 6 years     Types: Cigarettes   . Smokeless tobacco: Never Used   . Alcohol Use: Yes      Comment: 2x month   . Drug Use: No   . Sexual Activity: Yes     Partners: Male     Birth Control/ Protection: Pill     Other Topics Concern   . Abuse/Domestic Violence No   . Breast Self Exam No   . Calcium Intake Adequate Yes   . Drives Yes   . Seat Belt No   . Sunscreen Used No   . Right Hand Dominant Yes   . Shift Work No     Social History Narrative   . No narrative on file     Past Medical History   Diagnosis Date   . Hernia of unspecified site of abdominal cavity without mention of obstruction or gangrene      r/t pregnancy   . Asthma    . Headache(784.0)    . Heart disease    .  HTN    . Infertility female    . Kidney disease    . Hx of ectopic pregnancy 2010   . Miscarriage 09/04/2011      Past Surgical History   Procedure Laterality Date   . Hx cesarean section     . Hx salpingectomy  2010     Ectopic Pregnancy     Family History   Problem Relation Age of Onset   . Breast Cancer Paternal Grandmother    . Hypertension Multiple family members    . Diabetes Multiple family members    . High Cholesterol Multiple family members    . Pancreatic Cancer Neg Hx    . Ovarian Cancer Neg Hx    . Uterine Cancer Neg Hx    .  Rectal Cancer Neg Hx    . Colon Cancer Neg Hx    . Cervical Cancer Neg Hx        Outpatient Prescriptions Prior to Visit:  Norgestimate-Ethinyl Estradiol (equiv to: ORTHO TRI-CYCLEN) 0.18/0.215/0.25 mg-35 mcg (28) Oral Tab Take 1 Tab by mouth Once a day RXA 1416 Expiration 10/27/2013   valACYclovir (VALTREX) 500 mg Oral Tablet Take 500 mg by mouth Twice daily     No facility-administered medications prior to visit.  Allergies   Allergen Reactions   . Naproxen Anaphylaxis   . Nsaids (Non-Steroidal Anti-Inflammatory Drug) Anaphylaxis   . Penicillins Rash     "fever blisters in & around mouth"     Review of Systems   Genitourinary:        Refill of OCP and Valtrex RXs   All other systems reviewed and are negative.          Objective:   Physical Exam   Constitutional: She is oriented to person, place, and time. She appears well-developed and well-nourished. No distress.   Neck: Normal range of motion. No thyromegaly present.   Pulm:  Effort normal.    Breast exam:    Right normal breast;  with no masses, skin changes or nipple discharge.  Left normal breast;  with no masses, skin changes or nipple discharge.  There are fibrocystic changes to the right breast.  There are fibrocystic changes to the left breast.  No mass within the left breast.  No mass within the right breast.  No tenderness to right breast.  No tenderness to left breast.  No right axillary lymph nodes are palpable.  No left  axillary lymph nodes are palpable.    Abdomen:   Soft.   GU:    External Exam:    External exam normal (bimanual only).        Uterus:   Uterus is normal to exam.     Position:  Anteverted.    Contour:  Regular.    Mobility:  Mobile.            Adenexal findings:    No masses and no tenderness.          Lymphadenopathy:     She has no cervical adenopathy.   Neurological: She is alert and oriented to person, place, and time.   Skin: Skin is warm and dry.   Psychiatric: She has a normal mood and affect. Her behavior is normal. Judgment and thought content normal.     . BP 108/72  Ht 1.524 m (5')  Wt 71.4 kg (157 lb 6.5 oz)  BMI 30.74 kg/m2  LMP 06/12/2013    Assessment & Plan:       ICD-9-CM    1. Well woman exam with routine gynecological exam V72.31    2. Contraception V25.9 Norgestimate-Ethinyl Estradiol (equiv to: ORTHO TRI-CYCLEN) 0.18/0.215/0.25 mg-35 mcg (28) Oral Tab   3. Menorrhagia 626.2 Norgestimate-Ethinyl Estradiol (equiv to: ORTHO TRI-CYCLEN) 0.18/0.215/0.25 mg-35 mcg (28) Oral Tab   4. H/O herpes labialis V12.09 valACYclovir (VALTREX) 500 mg Oral Tablet       Valtrex 500 mg daily - take as needed refill.   Pap 2018  STI Screen:  Urine GC/CT sent  BSA, CBE 2015   Contraception: Ortho Tricyclen RX refill  Advised 100% condoms; safe sex discussion with condom kit given.   Recommended from new studies, natural Ca+ foods (listing given) x3/day instead of Ca+ supplements (higher heart disease risk) and Vit D3 400-600 IU/day.  Smoking cessation counseling with handouts given; reduce caffeine intake.  Strongly recommended Flu Vaccine 2015 - declines  RTN 1 year.    Sebastian Ache, NP 06/25/2013 09:39  Patient seen independently with co-signing physician present in clinic.               I agree with the findings and plan of care as documented in the note.  Any exceptions/additions are edited/noted.

## 2013-06-26 LAB — NEISSERIA GONORRHOEAE DNA BY PCR: NEISSERIA GONORRHOEAE: NEGATIVE

## 2013-06-26 LAB — CHLAMYDIA TRACHOMITIS DNA BY PCR (INHOUSE): CHLAMYDIA TRACHOMATIS: NEGATIVE

## 2013-08-30 ENCOUNTER — Encounter (HOSPITAL_BASED_OUTPATIENT_CLINIC_OR_DEPARTMENT_OTHER): Payer: Self-pay

## 2014-01-25 ENCOUNTER — Encounter (HOSPITAL_BASED_OUTPATIENT_CLINIC_OR_DEPARTMENT_OTHER): Payer: Self-pay

## 2014-01-25 ENCOUNTER — Ambulatory Visit: Payer: MEDICAID

## 2014-01-25 VITALS — BP 106/64 | Ht 60.0 in | Wt 129.2 lb

## 2014-01-25 DIAGNOSIS — N979 Female infertility, unspecified: Secondary | ICD-10-CM | POA: Insufficient documentation

## 2014-01-25 DIAGNOSIS — K469 Unspecified abdominal hernia without obstruction or gangrene: Secondary | ICD-10-CM | POA: Insufficient documentation

## 2014-01-25 DIAGNOSIS — Z8759 Personal history of other complications of pregnancy, childbirth and the puerperium: Secondary | ICD-10-CM | POA: Insufficient documentation

## 2014-01-25 DIAGNOSIS — B009 Herpesviral infection, unspecified: Secondary | ICD-10-CM | POA: Insufficient documentation

## 2014-01-25 DIAGNOSIS — Z8742 Personal history of other diseases of the female genital tract: Secondary | ICD-10-CM

## 2014-01-25 DIAGNOSIS — J45909 Unspecified asthma, uncomplicated: Secondary | ICD-10-CM | POA: Insufficient documentation

## 2014-01-25 DIAGNOSIS — N970 Female infertility associated with anovulation: Secondary | ICD-10-CM

## 2014-01-25 DIAGNOSIS — I519 Heart disease, unspecified: Secondary | ICD-10-CM | POA: Insufficient documentation

## 2014-01-25 DIAGNOSIS — Z9889 Other specified postprocedural states: Secondary | ICD-10-CM | POA: Insufficient documentation

## 2014-01-25 DIAGNOSIS — Z9089 Acquired absence of other organs: Secondary | ICD-10-CM | POA: Insufficient documentation

## 2014-01-25 DIAGNOSIS — Z3169 Encounter for other general counseling and advice on procreation: Secondary | ICD-10-CM | POA: Insufficient documentation

## 2014-01-25 DIAGNOSIS — I10 Essential (primary) hypertension: Secondary | ICD-10-CM | POA: Insufficient documentation

## 2014-01-25 HISTORY — DX: Female infertility associated with anovulation: N97.0

## 2014-01-25 NOTE — Progress Notes (Addendum)
Subjective:     Patient ID:  Anne Cummings is an 28 y.o. female   Chief Complaint:    Chief Complaint   Patient presents with    Infertility Counseling       HPI   For Infertility counseling:    28 yo G3 P1021 Single presents for above after self discontinuing her Ortho Tri-Cyclen OCPs in 05/2013 (05/2012 - 05/2013) and has been tying to conceive/unprotected sex for last 7 mos (previously tried with same partner in 2013 after SAB for x 10 mos).  States she has used her APP - cycles are every 29-30 days/3-4 days/moderate flow and has used ovulation kits with negative ovulation results/no molimina sx's.  Last TSH 02/2012 = 1.438.  Wants a referral for infertility.     Single with partner 3.5 years.   No health problems or meds.   SAB @ 11 + weeks 08/2011 with medical therapy   Ectopic pregnancy @ 12+ weeks with salpingectomy 2010     Past Medical History   Diagnosis Date    Hernia of unspecified site of abdominal cavity without mention of obstruction or gangrene      r/t pregnancy    Asthma     Headache(784.0)     Heart disease     HTN     Infertility female     Kidney disease     Hx of ectopic pregnancy 2010     Left salpinectomy     Miscarriage 09/04/2011      with medical therapy     Herpes labialis 06/25/2013     Valtrex med     Menorrhagia 06/23/2012     06/23/2012 Ortho TriCyclen RX      Past Medical History Pertinent Negatives   Diagnosis Date Noted    Abnormal Pap smear 09/04/11    Painful intercourse 09/04/11    Postpartum depression 09/04/11    Seizure 09/04/11    STD (sexually transmitted disease) 09/04/11    Unspecified disorder of thyroid 09/04/11    Vaginal infection 09/04/11    Liver disease 09/04/11    Osteoporosis 09/04/11    Abnormal Pap smear 06/23/12     Past Surgical History   Procedure Laterality Date    Hx cesarean section      Hx salpingectomy  2010     Ectopic Pregnancy     Past Surgical History Pertinent Negatives   Procedure Date Noted    Hx blood transfusion 09/04/2011       Outpatient  Prescriptions Prior to Visit:  Norgestimate-Ethinyl Estradiol (equiv to: ORTHO TRI-CYCLEN) 0.18/0.215/0.25 mg-35 mcg (28) Oral Tab Take 1 Tab by mouth Once a day RXA 1416 Expiration 10/27/2013   valACYclovir (VALTREX) 500 mg Oral Tablet Take 1 Tab (500 mg total) by mouth Once a day     No facility-administered medications prior to visit.\  Allergies   Allergen Reactions    Naproxen Anaphylaxis    Nsaids (Non-Steroidal Anti-Inflammatory Drug) Anaphylaxis    Penicillins Rash     "fever blisters in & around mouth"     Review of Systems   Genitourinary:        "Trying to conceive for last 7 + mos - wants infertility referral           Objective:   Physical Exam  Ortho Exam  BP 106/64 mmHg   Ht 1.524 m (5')   Wt 58.6 kg (129 lb 3 oz)   BMI 25.23 kg/m2   LMP 01/16/2014  Assessment & Plan:       ICD-9-CM    1. Infertility associated with anovulation 628.0 AMB CONSULT/REFERRAL INFERTILITY   2. S/P ectopic pregnancy 2010  V13.29 AMB CONSULT/REFERRAL INFERTILITY   3. History of one miscarriage 08/2011  V13.29 AMB CONSULT/REFERRAL INFERTILITY       Offer CRM referral - order placed.   Continue to monitor cycles.  Will need early USG to verify IUP once pregnant; prefers Faculty MD for [facility].       Carman Ching, NP  01/25/2014, 17:39  Patient seen independently with co-signing physician present in clinic.    Heron Nay, MD  01/26/2014, 15:15

## 2014-01-31 ENCOUNTER — Other Ambulatory Visit: Payer: Self-pay

## 2014-02-09 ENCOUNTER — Ambulatory Visit (INDEPENDENT_AMBULATORY_CARE_PROVIDER_SITE_OTHER): Payer: Self-pay | Admitting: Obstetrics & Gynecology

## 2014-02-09 DIAGNOSIS — Z8759 Personal history of other complications of pregnancy, childbirth and the puerperium: Secondary | ICD-10-CM

## 2014-02-09 DIAGNOSIS — Z8742 Personal history of other diseases of the female genital tract: Secondary | ICD-10-CM

## 2014-02-09 DIAGNOSIS — N970 Female infertility associated with anovulation: Secondary | ICD-10-CM

## 2014-02-09 NOTE — H&P (Signed)
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  Menarche at age 28 with secondary sexual development at approximately the same time. Some variation in cyclicity with menses q 24-36 days for 4 days. Dysmenrrhea described as 3/10 which decreases with onset of menses. Denies dysuria/dyschezia/dyspareunia. No changes with bowel habits at time of menses.  Orgasmic without pain.  OB Hx: 1) 2007- different partner- C-S at 38 weeks. Induction for GDMA and failure to progress. Viable female 8#9 oz. Breast fed for 1 month. Has had intermittent "odor similar to that of lactation" since augmentation.  2) 2010- Right ectopic pregnancy with salpingectomy.  3) 2013 ?SAB vs aborted ectopic. No documentation of IUP. No instrumentation.  GYN Hx: - STD (uses valcyclovir for herpes labialis); -IUD/abnormal Paps. Last PAP 2013 normal per patient report.  PNV; has been taking black cohosh and vitex, but stopped when she made this appointment.  Allergies: All NSAIDs cause anaphylaxis; PCN causes rash.  PSH- 1) C- S as above  2) Repair of umbilical hernia  3) Bilateral breast augmentation, sub-pectoral.  Fam Hx: 30 year old sister with 2 children, 30 year old brother with 3. Mother is 2857 and had hysterectomy in 5940's for ?fibroids?.; + thyroid -M ; +DM- M- NIDDM, MA x 2 IDDM; +HTN- "everybody"; +cancer- PGM- breast, PGF- prostate.  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 + tobacco/Social EtOH/-drugs.  Work-up  - cultures  TSH 1.438 mIu/ml  Hep C-  Hep B sAg -  Partner not present  PE  WDWN female in NAD  O X 4  HEENT: PERRLA; - JVD, HJR, acanthosis, thyromegaly.  Skin 1 large cafe au lait spot on r thigh, stable per patient; 9 tattoos all > 30 year old. Piercing of left nares.  Breasts- Augmented without masses or discharge  Chest- CTAP  CV- S1, S2 0 murmurs, rubs, gallops  Ext- 0 C/C/E  Abd: Well healed Pfannenstihl non-tender without masses, HSM, rebound  Neuro- Non-focal  Lymph-  non-focal  GYN: NEFG; BUS-  Vagina: Clear  Cervix: - CMTCorpus; NSSC/RV/RF; non-tender and mobile  Adnexae- Benign bilaterally    Pelvic Sonography:    Method: TV    Uterus:   Position: RV   Length: 3.98 cm   Width: 4.34 cm   Height: 4.35 cm   Cervical length: 3.99 cm   Endometrium: 15.67 mm Type II; Small amount of fluid in cavity  Right Ovary:   Length: 2.36 cm   Width: 1.38 cm   Height: 1.75 cm   Follicles:  1.  Recent ovulation  2. CL    Left ovary:   Length: 2.53 cm   Width: 1.91 cm   Height: 2.10 cm   Follicles:  1.  Quiescent  Fluid: None  Could not visualize SIN on left or fallopian tube  Impression: Normal scan  A: Secondary infertility  Rule out hormonal factor  Rule out female factor  Rule out Tubal Factor  P: HSG with doxycycline prophylaxis 100 mg X 5 days starting 2 days prior to study  Day 3 labs  S.A.  Discussed possibilities of care to include use of oral or injectable meds +/- IUI vs IVF. With this patient, I discussed her diagnosis, testing and  her plan of care. I spent a total of 50 minutes speaking with her with greater than 50% of that time counseling and coordinating care.

## 2014-02-11 ENCOUNTER — Other Ambulatory Visit (INDEPENDENT_AMBULATORY_CARE_PROVIDER_SITE_OTHER): Payer: Self-pay

## 2014-02-11 DIAGNOSIS — Z8619 Personal history of other infectious and parasitic diseases: Secondary | ICD-10-CM

## 2014-02-11 MED ORDER — VALACYCLOVIR 500 MG TABLET
500.0000 mg | ORAL_TABLET | Freq: Every day | ORAL | Status: DC
Start: 2014-02-11 — End: 2014-08-19

## 2014-02-11 NOTE — Telephone Encounter (Signed)
Approved Valtrex RX to new pharmacy - 6 mos refill;  Maralyn SagoSarah, please notify patient and tell her to call for refills.    Carman ChingPamela Julicia, NP 02/11/2014, 12:28

## 2014-02-11 NOTE — Telephone Encounter (Addendum)
Pt called requesting a refill of her Valtrex. Pt last seen 01/25/14. Medication Pended. Moss McSarah C Natha Guin, RN  02/11/2014, 10:42    From: Jodelle Grossourtney Jo Gautney  To: [name], [name], NP  Sent: 02/11/2014 8:39 AM EDT  Subject: Medication Renewal Request    Original authorizing provider: Carman ChingPamela Janeah, NP    Rhea Beltonourtney Jo Morel would like a refill of the following medications:  valACYclovir (VALTREX) 500 mg Oral Tablet Carman Ching[[name] Belen, NP]    Preferred pharmacy: [facility] - [place], [place] - [address]    Comment:  Could I have a refill sent to [facility]? I have changed insurance providers and my refill remaining is void and I'm almost out of the medication.

## 2014-03-13 ENCOUNTER — Ambulatory Visit
Admission: RE | Admit: 2014-03-13 | Discharge: 2014-03-13 | Disposition: A | Discharge status: Home / Self Care | Payer: BC Managed Care – PPO | Source: Ambulatory Visit | Attending: Geriatric Medicine | Admitting: Geriatric Medicine

## 2014-03-13 DIAGNOSIS — N970 Female infertility associated with anovulation: Secondary | ICD-10-CM | POA: Insufficient documentation

## 2014-03-13 LAB — LH: LUTEINIZING HORMONE: 3.5 m[IU]/mL

## 2014-03-13 LAB — FSH: FOLLICLE STIMULATING HORMONE: 7.2 m[IU]/mL

## 2014-03-13 LAB — PROLACTIN: PROLACTIN: 30 ng/mL

## 2014-03-13 LAB — ESTRADIOL: ESTRADIOL: 34 pg/mL

## 2014-03-13 LAB — THYROID STIMULATING HORMONE (SENSITIVE TSH): TSH: 1.552 u[IU]/mL (ref 0.350–5.000)

## 2014-03-15 ENCOUNTER — Other Ambulatory Visit (INDEPENDENT_AMBULATORY_CARE_PROVIDER_SITE_OTHER): Payer: Self-pay | Admitting: Obstetrics & Gynecology

## 2014-03-15 DIAGNOSIS — IMO0002 Reserved for concepts with insufficient information to code with codable children: Secondary | ICD-10-CM

## 2014-03-17 ENCOUNTER — Other Ambulatory Visit (INDEPENDENT_AMBULATORY_CARE_PROVIDER_SITE_OTHER): Payer: Self-pay | Admitting: Obstetrics & Gynecology

## 2014-03-17 DIAGNOSIS — IMO0002 Reserved for concepts with insufficient information to code with codable children: Secondary | ICD-10-CM

## 2014-03-17 LAB — TESTOSTERONE, TOTAL, LCMS, SERUM: TESTOSTERONE, TOTAL, SERUM: 42 ng/dL

## 2014-03-19 ENCOUNTER — Ambulatory Visit
Admission: RE | Admit: 2014-03-19 | Discharge: 2014-03-19 | Disposition: A | Discharge status: Home / Self Care | Payer: BC Managed Care – PPO | Source: Ambulatory Visit | Attending: Obstetrics & Gynecology | Admitting: Obstetrics & Gynecology

## 2014-03-19 DIAGNOSIS — Z538 Procedure and treatment not carried out for other reasons: Secondary | ICD-10-CM | POA: Insufficient documentation

## 2014-03-19 DIAGNOSIS — M4802 Spinal stenosis, cervical region: Secondary | ICD-10-CM | POA: Insufficient documentation

## 2014-03-19 DIAGNOSIS — Z319 Encounter for procreative management, unspecified: Secondary | ICD-10-CM

## 2014-03-19 DIAGNOSIS — IMO0002 Reserved for concepts with insufficient information to code with codable children: Secondary | ICD-10-CM

## 2014-03-19 DIAGNOSIS — N979 Female infertility, unspecified: Secondary | ICD-10-CM | POA: Insufficient documentation

## 2014-03-19 MED ORDER — IOPAMIDOL 300 MG IODINE/ML (61 %) INTRAVENOUS SOLUTION
3.00 mL | INTRAVENOUS | Status: AC
Start: 2014-03-19 — End: 2014-03-19
  Administered 2014-03-19: 13:00:00 3 mL via INTRAUTERINE

## 2014-03-22 ENCOUNTER — Ambulatory Visit (INDEPENDENT_AMBULATORY_CARE_PROVIDER_SITE_OTHER): Payer: BC Managed Care – PPO | Admitting: Obstetrics & Gynecology

## 2014-03-22 DIAGNOSIS — N979 Female infertility, unspecified: Secondary | ICD-10-CM

## 2014-03-22 DIAGNOSIS — IMO0002 Reserved for concepts with insufficient information to code with codable children: Secondary | ICD-10-CM

## 2014-03-22 NOTE — Progress Notes (Signed)
Was unable to do HSG in radiology in that patient had a stenotic cervix and could not get the cannula in. Here under SHG.  Sonohysterography    Precatheterization   Uterus    Position:  Midplane    Uterine wall: Normal    Endometrium: No evidence of masses or polyps    Left fallopian tube: Open without formation of hydrosalpinges. Free fluid to abdomen      Procedure Note:  A speculum was placed in the vagina and the cervix prepped with betadine.  A sonohysterography catheter was then inserted through the cervix into the endometrial cavity without difficulty.   The ultrasound probe was then placed into the vagina after the speculum was removed.  Saline was injected into the sonohysterography catheter and ultrasound was used to evaluate the cavity and her existing fallopian tube. Tube was open to flow and did not fill as in a hydrosalpinges. Fluid seen in the abdomen after the infusion.  Pictures were taken during the procedure.  The patient tolerated the procedure well.    FINDINGS: Normal cavity  Patent left fallopian tube    IMPRESSION: Patent tube, normal cavity  P: Follow per protocol.

## 2014-03-23 NOTE — Procedures (Signed)
No note

## 2014-03-28 NOTE — Progress Notes (Signed)
Unable to perform HSG due to cervical stenosis and intolerance of patient to procedure.

## 2014-04-05 ENCOUNTER — Encounter (INDEPENDENT_AMBULATORY_CARE_PROVIDER_SITE_OTHER): Payer: Self-pay | Admitting: Obstetrics & Gynecology

## 2014-04-05 NOTE — Progress Notes (Signed)
HSG attempted in radiology, but cannula would not fir t ijnto the cervix.  APre-Procedure Diagnosis: 28 y.o. G 0    Procedure:  A sterile speculum was placed in the vagina and the vagina and cervix were prepped.  The anterior lip of the cervix was grasped with a single toothed tenaculum and an acorn cannula was inserted into the endocervical canal.  Contrast material was then attempted to be injected into the endometrial cavity under fluoroscopic guidance, but the cannula kept folding ijn the cervix and the procedure was abandoned.    Findings: Failed attempt at HSG    I was present and performned the entire HSG

## 2014-04-14 ENCOUNTER — Encounter (INDEPENDENT_AMBULATORY_CARE_PROVIDER_SITE_OTHER): Payer: Self-pay | Admitting: Obstetrics & Gynecology

## 2014-04-14 ENCOUNTER — Ambulatory Visit (INDEPENDENT_AMBULATORY_CARE_PROVIDER_SITE_OTHER): Payer: Self-pay | Admitting: Obstetrics & Gynecology

## 2014-04-14 VITALS — BP 110/60 | Wt 127.9 lb

## 2014-04-14 DIAGNOSIS — E221 Hyperprolactinemia: Secondary | ICD-10-CM

## 2014-04-14 MED ORDER — CLOMIPHENE CITRATE 50 MG TABLET
50.0000 mg | ORAL_TABLET | Freq: Every day | ORAL | Status: DC
Start: 2014-04-14 — End: 2014-05-17

## 2014-04-14 MED ORDER — BROMOCRIPTINE 2.5 MG TABLET
2.5000 mg | ORAL_TABLET | Freq: Every day | ORAL | Status: AC
Start: 2014-04-14 — End: ?

## 2014-04-14 NOTE — Progress Notes (Addendum)
Currently Day 3. Desires COH. No specific complaints.  Focused Reproductive Endocrinology and Infertility Examination   Uterus:    Position: Midplane    Endometrial thickness:   5.3 mm    Endometrial pattern:  Menstrual    Uterine wall: Normal     Right Ovary:      Follicles:   1. Quiescent    Left Ovary:     Follicles:   1. Quiescent       Fluid:None  A: Mild elevation in prolactin  For COH  P: Parlodel 2.5 mg po qhs. Repeat in three weeks  CC 50 mg po q days 5-9  Sex Days 12, 14, 16, 18  RTC 3 weeks on day 35 with no menses  With this patient,I discussed her dx,testing and plan of care.I spent a total of 10 min with her,with greater than 50% counsel and coordinating care.

## 2014-04-14 NOTE — Progress Notes (Signed)
Pt reports negative pregnancy test at home. Sandie AnoVicki Shuntel Fishburn, LPN  16/[id number]/960412/16/2015, 08:12

## 2014-05-14 ENCOUNTER — Ambulatory Visit (INDEPENDENT_AMBULATORY_CARE_PROVIDER_SITE_OTHER): Payer: BC Managed Care – PPO | Admitting: Reproductive Endocrinology

## 2014-05-14 ENCOUNTER — Ambulatory Visit (INDEPENDENT_AMBULATORY_CARE_PROVIDER_SITE_OTHER): Payer: BC Managed Care – PPO

## 2014-05-14 VITALS — Ht 60.0 in | Wt 131.0 lb

## 2014-05-14 DIAGNOSIS — N8320 Unspecified ovarian cysts: Secondary | ICD-10-CM

## 2014-05-14 DIAGNOSIS — Z8759 Personal history of other complications of pregnancy, childbirth and the puerperium: Secondary | ICD-10-CM

## 2014-05-14 DIAGNOSIS — N83209 Unspecified ovarian cyst, unspecified side: Secondary | ICD-10-CM

## 2014-05-14 DIAGNOSIS — N970 Female infertility associated with anovulation: Principal | ICD-10-CM

## 2014-05-14 LAB — ESTRADIOL: ESTRADIOL: 28.08 pg/mL

## 2014-05-14 NOTE — Progress Notes (Signed)
Pt reports negative pregnancy test at home.   Sandie AnoVicki Cavon Nicolls, LPN  5/78/46961/15/2016, 11:16

## 2014-05-14 NOTE — Progress Notes (Signed)
29 yo G3 P1021   2007:  C/S @ 38 weeks (different partner), GDM   2010:  Right ectopic with salpingectomy   2013: Missed AB, treated with Misoprostil    Secondary infertility   Female: Proven x 2, SA not done   Menses @ 24-36 day intervals   03/14/14, normal TSH, Prolactin 30, LH 3.5, FSH 7.2, E2 34, testosterone 42 ng/dl   Cervix:  Proven   Uterus:  Normal by HSG on 03/22/14   Tubes:  Absent right tube, patent left tube by HSG report   Peritoneal:  Prior surgery   Ovarian reserve:  Normal day 3 labs    Prior therapy:    Parlodel and CC    Menses on 12/14, used Parlodel and CC, positive OPK.  Now with LMP 05/13/14    OBJ   Uterus ok   Right ovary with 10.8 mm cyst    IMP  1.  ? Hyperprolactinemia  2.  ? Persistent cyst or new follicle    Plan  1.  Check prolactin  2.  Recheck FSH and E2 today.  If E2 is low, will repeat CC 50 x 5 and scan to see if she has a left-sided follicle    Anne Portlandoger Mykael Trott, MD  05/14/2014, 11:36

## 2014-05-17 ENCOUNTER — Encounter (INDEPENDENT_AMBULATORY_CARE_PROVIDER_SITE_OTHER): Payer: Self-pay | Admitting: Reproductive Endocrinology

## 2014-05-17 MED ORDER — CLOMIPHENE CITRATE 50 MG TABLET
50.0000 mg | ORAL_TABLET | Freq: Every day | ORAL | Status: DC
Start: 2014-05-17 — End: 2014-06-14

## 2014-05-17 NOTE — Progress Notes (Signed)
29 yo G3 P1021    Ultrasound showed a 10.8 cyst or follicle on the right    Labs   FSH  6.85 mIU/ml   E2  28.08 pg/ml    IMP  1.  Non-functional cyst    Plan  1.  Start CC 50 x 5 today    Amie Portlandoger Stacey Sago, MD  05/17/2014, 07:35

## 2014-05-18 ENCOUNTER — Encounter (INDEPENDENT_AMBULATORY_CARE_PROVIDER_SITE_OTHER): Payer: Self-pay | Admitting: Reproductive Endocrinology

## 2014-05-18 NOTE — Addendum Note (Signed)
Addended by: Minda MeoLEMMER, Roldan Laforest J on: 05/18/2014 09:12 AM     Modules accepted: Orders

## 2014-05-18 NOTE — H&P (Signed)
[facility]  Amie Portlandoger Danyela Posas, MD  05/18/2014, 08:04

## 2014-05-18 NOTE — Progress Notes (Signed)
28 yo G3 P105921 with mild hyperprolactinemia, now on Parlodel 2.5 mg/d and a 10.8 mm cyst on the right on 05/17/13    Labs   FSH  6.85 mIU/ml   E2  28.08 pg/ml   PRL  2.77 ng/ml    IMP  1.  Non-functional cyst  2.  Over suppression of prolactin    Plan  1.  Decrease Parlodel to 1.25 mg/d  2.  Repeat CC 50 x 5    Amie Portlandoger Joaovictor Krone, MD  05/18/2014, 13:18

## 2014-05-21 ENCOUNTER — Encounter (INDEPENDENT_AMBULATORY_CARE_PROVIDER_SITE_OTHER): Payer: BC Managed Care – PPO | Admitting: Obstetrics & Gynecology

## 2014-05-24 ENCOUNTER — Ambulatory Visit (INDEPENDENT_AMBULATORY_CARE_PROVIDER_SITE_OTHER): Payer: BC Managed Care – PPO | Admitting: Obstetrics & Gynecology

## 2014-05-24 DIAGNOSIS — Z319 Encounter for procreative management, unspecified: Principal | ICD-10-CM

## 2014-05-24 NOTE — Procedures (Signed)
See progress note.

## 2014-05-24 NOTE — Progress Notes (Signed)
Midcycle in CC COH. No specific complaints.  Focused Reproductive Endocrinology and Infertility Examination   Uterus:    Position: Midcycle    Endometrial thickness:   10.0 mm mm    Endometrial pattern:  Type II- Peri-ovulatory    Uterine wall: Normal     Right Ovary:      Follicles:   1. 26    Left Ovary:     Follicles:   1. 24       Fluid:None  A: Follicular maturity  P: hCG today  IUI tomorrow

## 2014-05-25 ENCOUNTER — Ambulatory Visit (INDEPENDENT_AMBULATORY_CARE_PROVIDER_SITE_OTHER): Payer: Self-pay

## 2014-05-25 ENCOUNTER — Ambulatory Visit (INDEPENDENT_AMBULATORY_CARE_PROVIDER_SITE_OTHER): Payer: Self-pay | Admitting: Obstetrics & Gynecology

## 2014-05-25 DIAGNOSIS — Z3189 Encounter for other procreative management: Secondary | ICD-10-CM

## 2014-05-25 DIAGNOSIS — Z319 Encounter for procreative management, unspecified: Secondary | ICD-10-CM

## 2014-05-25 NOTE — Progress Notes (Signed)
Pt. First  Name: Anne BashReginald  Pt. Last Name: [name]  Wife's First Name: Anne AmendCourtney  Wife's Last Name: [name]  Physician: Anne BerlinGary M. Abundio MiuHorowitz, Anne Cummings  Time of Ejaculate: [phone]  Time Sample Received: 0955  Time of Analysis: 1018  Lab Number: [id number]  Date: 05/25/14  Days of Abstinence: 5  Method : Cup  Method 2: Masturbation  Any Sample Lost: No  Technologist: DZ    Volume (mls) [normal 1.5 to 5 ml]: 1.1  Color [normal white to gray]: Gray  pH Value [normal 7.5 to 8.5]: n/a  Round Cells(normal/hpf) [<5/hpf]: 0-1  Viscosity [normal <2]: 1  Debris [normal low]: Low  Agglutination [normal no]: no  Crystals [normal no]: no  Liquefaction [normal yes]: yes  Gel [normal no]: no  Total Sperm [normal >15(million/ml)]: 54.3  Active Sperm [normal >10(million/ml)]: 31.7  % Motile [normal >40%]: 58.3  Grade [normal 3 to 4]: 3.5  Technologist: DZ    Volume (mls): 0.5  Active Sperm Count: 75.3  Total Sperm Count: 79.7  % Motile: 94.6  Grade: 4.0  Total Motile Final: 37.65  Method of isolation: Gradient Centrifugation  Media #: [id number]  Gradient #: [id number]  Specimen prepared by: DZ  Specimen received by: VB    Andrologist: Anne Cummings, Anne Cummings, Anne Cummings      Anne Cummings, Andrologist 05/25/2014, 11:10

## 2014-05-25 NOTE — Progress Notes (Signed)
The patient is a 29 y.o. y.o. G0 who presents for IUI    Insemination:  Type of Insemination: IUUI  Sperm:   Pre-Wash:     1.1 ml; 54.3 million/ml; 58.3% motile     Post-Wash:   0.5 ml; 75.3 million/ml;  94.6% motile, Total Motile: 37.65  million    Procedure Note:  The specimen was identified by the patient.  A speculum was placed into the vagina.  Clear mucus was present.  A catheter attached to a syringe containing the prepared semen specimen was advanced through the cervix into the endometrial cavity.  The specimen was then slowly injected into the endometrial cavity

## 2014-06-14 ENCOUNTER — Ambulatory Visit (INDEPENDENT_AMBULATORY_CARE_PROVIDER_SITE_OTHER): Payer: BC Managed Care – PPO | Admitting: Obstetrics & Gynecology

## 2014-06-14 ENCOUNTER — Encounter (INDEPENDENT_AMBULATORY_CARE_PROVIDER_SITE_OTHER): Payer: Self-pay | Admitting: Obstetrics & Gynecology

## 2014-06-14 VITALS — BP 122/72 | Wt 130.1 lb

## 2014-06-14 DIAGNOSIS — N970 Female infertility associated with anovulation: Secondary | ICD-10-CM

## 2014-06-14 MED ORDER — CLOMIPHENE CITRATE 50 MG TABLET
50.0000 mg | ORAL_TABLET | Freq: Every day | ORAL | Status: AC
Start: 2014-06-14 — End: ?

## 2014-06-14 MED ORDER — NORGESTREL 0.3 MG-ETHINYL ESTRADIOL 30 MCG TABLET
1.0000 | ORAL_TABLET | Freq: Every day | ORAL | Status: DC
Start: 2014-06-14 — End: 2014-07-12

## 2014-06-14 NOTE — Progress Notes (Signed)
Currently Day 3 of cycle.  Desires COH. No specific complaints. Negative uPT at home  Focused Reproductive Endocrinology and Infertility Examination   Uterus:    Position: Midplane    Endometrial thickness:   9.91 mm     Endometrial pattern:  Menstrual    Uterine wall: Normal     Right Ovary:      Follicles:   1. Quiescent    Left Ovary:     Follicles:   1. Quiescent       Fluid:None  A; For COH  P: CC 50 mg po q days 5-9  RTC Day 14  With this patient,I discussed her dx,testing and plan of care.I spent a total of 10 min with her,with greater than 50% counsel and coordinating care.

## 2014-06-14 NOTE — Progress Notes (Signed)
Patient states negative upt at home.

## 2014-07-12 ENCOUNTER — Encounter (INDEPENDENT_AMBULATORY_CARE_PROVIDER_SITE_OTHER): Payer: Self-pay | Admitting: Obstetrics & Gynecology

## 2014-07-12 ENCOUNTER — Ambulatory Visit (INDEPENDENT_AMBULATORY_CARE_PROVIDER_SITE_OTHER): Payer: BC Managed Care – PPO | Admitting: Obstetrics & Gynecology

## 2014-07-12 VITALS — BP 102/64 | Wt 136.7 lb

## 2014-07-12 DIAGNOSIS — Z319 Encounter for procreative management, unspecified: Secondary | ICD-10-CM

## 2014-07-12 MED ORDER — LETROZOLE 2.5 MG TABLET
2.5000 mg | ORAL_TABLET | Freq: Every day | ORAL | Status: DC
Start: 2014-07-12 — End: 2015-02-07

## 2014-07-12 NOTE — Progress Notes (Signed)
Finished with BCPs. No specific complaints. [facility]  Focused Reproductive Endocrinology and Infertility Examination   Uterus:    Position: Midplane    Endometrial thickness:   5.87 mm    Endometrial pattern:  Menstrual    Uterine wall: Normal     Right Ovary:      Follicles:   1. Quiescent    Left Ovary:     Follicles:   1. Quiescent       Fluid:None  A: Persistent follicle resorbed. For COH  P: :Letrozole 2.5 mg po q days 5-9  RTC Day 14 for U/S  With this patient,I discussed her dx,testing and plan of care.I spent a total of 10 min with her,with greater than 50% counsel and coordinating care.

## 2014-07-12 NOTE — Progress Notes (Signed)
Patient states negative upt at home.

## 2014-07-22 ENCOUNTER — Ambulatory Visit (INDEPENDENT_AMBULATORY_CARE_PROVIDER_SITE_OTHER): Payer: BC Managed Care – PPO | Admitting: Obstetrics & Gynecology

## 2014-07-22 DIAGNOSIS — Z319 Encounter for procreative management, unspecified: Secondary | ICD-10-CM

## 2014-07-22 NOTE — Progress Notes (Signed)
Mid-cycle in cycle of COH with letrozole. Complains of some palpitations and light-headedness.   PE  CV: RRR 84 BMP no ectopy noted.  Focused Reproductive Endocrinology and Infertility Examination   Uterus:    Position: Midplane    Endometrial thickness:   6.99 mm    Endometrial pattern:  Type II Late follicular    Uterine wall: Normal     Right Ovary:      Follicles:   1. 25  2. 16    Left Ovary:     Follicles:   1. Small only       Fluid:None  A: hCG now  IUI tomorrow

## 2014-07-23 ENCOUNTER — Ambulatory Visit (INDEPENDENT_AMBULATORY_CARE_PROVIDER_SITE_OTHER): Payer: Self-pay

## 2014-07-23 ENCOUNTER — Ambulatory Visit (INDEPENDENT_AMBULATORY_CARE_PROVIDER_SITE_OTHER): Payer: Self-pay | Admitting: Obstetrics & Gynecology

## 2014-07-23 DIAGNOSIS — Z319 Encounter for procreative management, unspecified: Secondary | ICD-10-CM

## 2014-07-23 NOTE — H&P (Signed)
The patient is a 29 y.o. y.o. G0 who presents for IUI    Insemination:  Type of Insemination: IUI  Sperm:   Pre-Wash:     1.2 ml; 76.7 million/ml; 71.7% motile     Post-Wash:   0.5 ml; 51.7 million/ml; 95.1% motile, Total Motile:  25.85 million    Procedure Note:  The specimen was identified by the patient.  A speculum was placed into the vagina.  Clear mucus was present.  A catheter attached to a syringe containing the prepared semen specimen was advanced through the cervix into the endometrial cavity.  The specimen was then slowly injected into the endometrial cavity

## 2014-07-23 NOTE — Progress Notes (Signed)
Pt. First  Name: Anne  Pt. Last Name: Cummings  Wife's First Name: Anne  Wife's Last Name: Cummings  Physician: Gary M. Horowitz, MD  Time of Ejaculate: 1002  Time Sample Received: 1002  Time of Analysis: 1012  Lab Number: [id number]  Date: 07/23/14  Days of Abstinence: 3  Method : Cup  Method 2: Masturbation  Any Sample Lost: No  Technologist: DZ    Volume (mls) [normal 1.5 to 5 ml]: 1.2  Color [normal white to gray]: Gray  pH Value [normal 7.5 to 8.5]: n/a  Round Cells(normal/hpf) [<5/hpf]: 0  Viscosity [normal <2]: 1  Debris [normal low]: Low  Agglutination [normal no]: no  Crystals [normal no]: no  Liquefaction [normal yes]: yes  Gel [normal no]: no  Total Sperm [normal >15(million/ml)]: 76.7  Active Sperm [normal >10(million/ml)]: 55  % Motile [normal >40%]: 71.7  Grade [normal 3 to 4]: 4.0  Technologist: DZ    Volume (mls): 0.5  Active Sperm Count: 51.7  Total Sperm Count: 54.3  % Motile: 95.1  Grade: 4.0  Total Motile Final: 25.85  Method of isolation: Gradient Centrifugation  Media #: [id number]  Gradient #: [id number]  Specimen prepared by: DZ  Specimen received by: VB    Andrologist: Donald M. Clee Pandit, BS, MT      Luisfernando Brightwell, Andrologist 07/23/2014, 10:54

## 2014-08-10 ENCOUNTER — Ambulatory Visit (INDEPENDENT_AMBULATORY_CARE_PROVIDER_SITE_OTHER): Payer: BC Managed Care – PPO | Admitting: Obstetrics & Gynecology

## 2014-08-10 DIAGNOSIS — N8300 Follicular cyst of ovary, unspecified side: Secondary | ICD-10-CM

## 2014-08-10 NOTE — Progress Notes (Signed)
Currently Day 3 of cycle. Negative uPT @ home. No specific complaints  Focused Reproductive Endocrinology and Infertility Examination   Uterus:    Position: Midplane    Endometrial thickness:   6.57 mm    Endometrial pattern:  Menstrual    Uterine wall: Normal     Right Ovary:      Follicles:   1. Quiescent    Left Ovary:     Follicles:   1. Quiescent       Fluid:None  A: Resolved cyst  P. Discussed the utilization of one of the following protocols:  1) Continue the same as we have- Not recommended  2)  Combined protocol  3) Pure GNs  4) IVF  All options fully discussed. Patient's to recontact to tell us which they prefer.  With this patient,I discussed her dx,testing and plan of care.I spent a total of 10 min with her,with greater than 50% counsel and coordinating care.

## 2014-08-19 ENCOUNTER — Other Ambulatory Visit (HOSPITAL_BASED_OUTPATIENT_CLINIC_OR_DEPARTMENT_OTHER): Payer: Self-pay

## 2014-08-19 ENCOUNTER — Ambulatory Visit (INDEPENDENT_AMBULATORY_CARE_PROVIDER_SITE_OTHER): Payer: BC Managed Care – PPO | Admitting: Obstetrics & Gynecology

## 2014-08-19 DIAGNOSIS — Z8619 Personal history of other infectious and parasitic diseases: Secondary | ICD-10-CM

## 2014-08-19 DIAGNOSIS — Z319 Encounter for procreative management, unspecified: Principal | ICD-10-CM

## 2014-08-19 MED ORDER — VALACYCLOVIR 500 MG TABLET
500.0000 mg | ORAL_TABLET | Freq: Every day | ORAL | Status: DC
Start: 2014-08-19 — End: 2015-02-09

## 2014-08-19 NOTE — Procedures (Signed)
See progress note.

## 2014-08-19 NOTE — Telephone Encounter (Signed)
Approved Valtrex RX; Sarah, please notify patient. Carman ChingPamela Khadeja, NP 08/19/2014, 12:58

## 2014-08-19 NOTE — Progress Notes (Signed)
Midcycle in combined cycle. No specific complaints  Focused Reproductive Endocrinology and Infertility Examination   Uterus:    Position: Midplane    Endometrial thickness:   8.31 mm    Endometrial pattern:  Type I Trilaminar    Uterine wall: Normal     Right Ovary:      Follicles:   1. 14 mm  2. 12 mm  3. 15 mm    Left Ovary:     Follicles:   1. 22 mm  2. 15 mm       Fluid:None  A: For hCG  P: hCG now  IUI tomorrow

## 2014-08-19 NOTE — Telephone Encounter (Addendum)
Pt called requesting a refill of her valtrex. Pt last seen 01/25/14. Medication Pended. Moss McSarah C Artemio Dobie, RN  08/19/2014, 08:18    From: Rhea Beltonourtney Jo Langlois  To: [name], [name], NP  Sent: 08/19/2014 7:55 AM EDT  Subject: Medication Renewal Request    Original authorizing provider: Carman ChingPamela Taneshia, NP    Rhea Beltonourtney Jo Mertens would like a refill of the following medications:  valACYclovir (VALTREX) 500 mg Oral Tablet Carman Ching[[name] Elliott, NP]    Preferred pharmacy: [facility] - [place], [place] - [address] AT [address]    Comment:

## 2014-08-20 ENCOUNTER — Ambulatory Visit (INDEPENDENT_AMBULATORY_CARE_PROVIDER_SITE_OTHER): Payer: BC Managed Care – PPO | Admitting: Obstetrics & Gynecology

## 2014-08-20 ENCOUNTER — Encounter (INDEPENDENT_AMBULATORY_CARE_PROVIDER_SITE_OTHER): Payer: BC Managed Care – PPO | Admitting: Obstetrics & Gynecology

## 2014-08-20 ENCOUNTER — Ambulatory Visit (INDEPENDENT_AMBULATORY_CARE_PROVIDER_SITE_OTHER): Payer: Self-pay

## 2014-08-20 DIAGNOSIS — Z319 Encounter for procreative management, unspecified: Secondary | ICD-10-CM

## 2014-08-20 NOTE — Progress Notes (Signed)
The patient is a 29 y.o. y.o. G0 who presents for IUI    Insemination:  Type of Insemination: IUI  Sperm:   Pre-Wash:     1.5 ml; 116.7 million/ml; 45.8% motile     Post-Wash:   0.5 ml; 50.3 million/ml; 96.8% motile, Total Motile:  25.15 million    Procedure Note:  The specimen was identified by the patient.  A speculum was placed into the vagina.  Clear mucus was present.  A catheter attached to a syringe containing the prepared semen specimen was advanced through the cervix into the endometrial cavity.  The specimen was then slowly injected into the endometrial cavity

## 2014-08-20 NOTE — Progress Notes (Signed)
Pt. First  Name: Anne  Pt. Last Name: Cummings  Wife's First Name: Aireana  Wife's Last Name: Yeager  Physician: Gary M. Horowitz, MD  Time of Ejaculate: 0924  Time Sample Received: 0924  Time of Analysis: 0942  Lab Number: [id number]  Date: 08/20/14  Days of Abstinence: 2  Method : Cup  Method 2: Masturbation  Any Sample Lost: No  Technologist: DZ    Volume (mls) [normal 1.5 to 5 ml]: 1.5  Color [normal white to gray]: Gray  pH Value [normal 7.5 to 8.5]: 8.5  Round Cells(normal/hpf) [<5/hpf]: 0  Viscosity [normal <2]: 1  Debris [normal low]: Low  Agglutination [normal no]: no  Crystals [normal no]: no  Liquefaction [normal yes]: yes  Gel [normal no]: no  Total Sperm [normal >15(million/ml)]: 106.7  Active Sperm [normal >10(million/ml)]: 46.7  % Motile [normal >40%]: 43.8  Grade [normal 3 to 4]: 4.0  Technologist: DZ    Volume (mls): 0.5  Active Sperm Count: 50.3  Total Sperm Count: 52  % Motile: 96.8  Grade: 4.0  Total Motile Final: 25.15  Method of isolation: Gradient Centrifugation  Media #: [id number]  Gradient #: [id number]  Specimen prepared by: DZ  Specimen received by: ME    Andrologist: Donald M. Jaleiah Asay, BS, MT      Zea Kostka, Andrologist 08/20/2014, 13:42

## 2014-09-10 ENCOUNTER — Ambulatory Visit (INDEPENDENT_AMBULATORY_CARE_PROVIDER_SITE_OTHER): Payer: BC Managed Care – PPO | Admitting: Obstetrics & Gynecology

## 2014-09-10 ENCOUNTER — Encounter (INDEPENDENT_AMBULATORY_CARE_PROVIDER_SITE_OTHER): Payer: Self-pay | Admitting: Obstetrics & Gynecology

## 2014-09-10 VITALS — BP 112/66 | Wt 136.7 lb

## 2014-09-10 DIAGNOSIS — N736 Female pelvic peritoneal adhesions (postinfective): Principal | ICD-10-CM

## 2014-09-10 DIAGNOSIS — Z01818 Encounter for other preprocedural examination: Secondary | ICD-10-CM

## 2014-09-10 NOTE — Progress Notes (Signed)
Patient states negative upt at home.

## 2014-09-10 NOTE — Progress Notes (Signed)
Patiernt now with repeated cycles of COH/IUI without success. Previous history of ectopic requiring removal of one tube. 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 Will now move to L/S exploration for possible repair and viewing for endometriosis. We have discussed the indications and alternatives for the procedure. We have also discussed the risks. Risks to include, but not limited to, infection, hemorrhage, damage to bowel, bladder, pelvic organs/vessels, possibly requiring further surgery; no guarantee of success, i.e. Term intrauterine pregnancy; thrombosis, even death. Patient states she understands fully without further questions. Cleared for L/S

## 2014-11-10 ENCOUNTER — Ambulatory Visit (INDEPENDENT_AMBULATORY_CARE_PROVIDER_SITE_OTHER): Payer: BC Managed Care – PPO

## 2014-11-10 ENCOUNTER — Other Ambulatory Visit (INDEPENDENT_AMBULATORY_CARE_PROVIDER_SITE_OTHER): Payer: BC Managed Care – PPO

## 2014-11-10 DIAGNOSIS — Z3201 Encounter for pregnancy test, result positive: Secondary | ICD-10-CM

## 2014-11-10 LAB — HCG, PLASMA OR SERUM QUANTITATIVE, PREGNANCY: HCG QUANTITATIVE/PREG: 3522 m[IU]/mL

## 2014-11-18 ENCOUNTER — Encounter (FREE_STANDING_LABORATORY_FACILITY)
Admit: 2014-11-18 | Discharge: 2014-11-18 | Disposition: A | Discharge status: Home / Self Care | Payer: BC Managed Care – PPO | Attending: Obstetrics & Gynecology | Admitting: Obstetrics & Gynecology

## 2014-11-18 ENCOUNTER — Ambulatory Visit (INDEPENDENT_AMBULATORY_CARE_PROVIDER_SITE_OTHER): Payer: BC Managed Care – PPO | Admitting: Obstetrics & Gynecology

## 2014-11-18 DIAGNOSIS — O209 Hemorrhage in early pregnancy, unspecified: Secondary | ICD-10-CM

## 2014-11-18 DIAGNOSIS — O26851 Spotting complicating pregnancy, first trimester: Secondary | ICD-10-CM

## 2014-11-18 DIAGNOSIS — O4691 Antepartum hemorrhage, unspecified, first trimester: Secondary | ICD-10-CM | POA: Insufficient documentation

## 2014-11-18 LAB — ABO/RH AND ANTIBODY SCREEN
ABO/RH(D): O POS
ANTIBODY SCREEN: NEGATIVE

## 2014-11-18 NOTE — Progress Notes (Signed)
Small amount of brown spotting. No cramping or other symptoms. Last coitus 5 days ago. LMP 10 Jun 16  First-Trimester Ultrasound Examination Anne Cummings)    Transvaginal:   Indication: Spotting   Uterus: Normal   Gestational Sac:  17 mm  o Length: 17 mm   o Width: 12 mm   o Height: 15 mm      Mean:  16 mm    Gestational Age: 31 + 0 wks   Yolk Sac:   2.76 mm   Fetal Pole    o CRL:4.62 mm    o Gestational Age: 31 + 1  wks  o Cardiac Activity: Negative  o Fetal movement: N/A   Right Ovary: Normal   Left Ovary: Normal   Cul de sac fluid: None   Abnormalities: None noted    ZOX:WRUEAV characteristics. RTC 1 week to see FCA

## 2014-11-19 ENCOUNTER — Encounter (INDEPENDENT_AMBULATORY_CARE_PROVIDER_SITE_OTHER): Payer: BC Managed Care – PPO | Admitting: Obstetrics & Gynecology

## 2014-11-29 ENCOUNTER — Ambulatory Visit (INDEPENDENT_AMBULATORY_CARE_PROVIDER_SITE_OTHER): Payer: BC Managed Care – PPO | Admitting: Obstetrics & Gynecology

## 2014-11-29 DIAGNOSIS — O26851 Spotting complicating pregnancy, first trimester: Secondary | ICD-10-CM

## 2014-11-29 DIAGNOSIS — Z3490 Encounter for supervision of normal pregnancy, unspecified, unspecified trimester: Secondary | ICD-10-CM

## 2014-11-29 DIAGNOSIS — O200 Threatened abortion: Secondary | ICD-10-CM

## 2014-11-29 NOTE — Progress Notes (Signed)
No cramping, still with brown spotting but decreased amount  First-Trimester Ultrasound Examination Anne Cummings)    Transvaginal:   Indication: Surveillance   Uterus: None   Gestational Sac:  18  o Length: 1.7  cm   o Width: 1.3  cm   o Height: 1.7 cm     Mean:  1.7  cm    Gestational Age: 31 + 0 wks   Yolk Sac:   2.85 mm   Fetal Pole    o CRL:4.8 mm    o Gestational Age: 31 + 1  wks  o Cardiac Activity: ?  o Fetal movement: N/A   Right Ovary: Normnal   Left Ovary: Normal   Cul de sac fluid: None   Abnormalities: ? growth    ZOX:WRUEAVWU loss at 6 + weeks  P: RTC 2 weeks or with bleeding/cramping  Possible cardiac activity we did not see last time.  Will follow conservatively

## 2014-12-06 ENCOUNTER — Ambulatory Visit (HOSPITAL_COMMUNITY)
Admission: RE | Admit: 2014-12-06 | Payer: BC Managed Care – PPO | Source: Ambulatory Visit | Admitting: Obstetrics & Gynecology

## 2014-12-06 ENCOUNTER — Encounter (HOSPITAL_COMMUNITY): Admission: RE | Payer: Self-pay | Source: Ambulatory Visit

## 2014-12-06 SURGERY — LAPAROSCOPY DIAGNOSTIC
Anesthesia: General

## 2014-12-13 ENCOUNTER — Ambulatory Visit (INDEPENDENT_AMBULATORY_CARE_PROVIDER_SITE_OTHER): Payer: BC Managed Care – PPO | Admitting: Obstetrics & Gynecology

## 2014-12-13 ENCOUNTER — Ambulatory Visit
Admission: RE | Admit: 2014-12-13 | Discharge: 2014-12-13 | Disposition: A | Discharge status: Home / Self Care | Payer: BC Managed Care – PPO | Source: Ambulatory Visit | Attending: Obstetrics & Gynecology | Admitting: Obstetrics & Gynecology

## 2014-12-13 DIAGNOSIS — O021 Missed abortion: Secondary | ICD-10-CM

## 2014-12-13 NOTE — Procedures (Signed)
See progress note.

## 2014-12-13 NOTE — Progress Notes (Signed)
First-Trimester Ultrasound Examination Anne Cummings)    Transvaginal:   Indication: Probable missed abortion   Uterus: Normal   Gestational Sac:  Cranated and 17 mm  o Length: 16 mm   o Width: 17 mm   o Height: 15 mm     Mean:  16 mm    Gestational Age: 31 + 1 wks   Yolk Sac:   Absent mm   Fetal Pole    o CRL:0.4 mm    o Gestational Age: ?  wks  o Cardiac Activity: Negative  o Fetal movement: None   Right Ovary: Normal   Left Ovary: Normal   Cul de sac fluid: None   Abnormalities: Probable missed abortioon    ZOX:WRUEAV abortion  To U/S for verification

## 2014-12-14 ENCOUNTER — Telehealth (INDEPENDENT_AMBULATORY_CARE_PROVIDER_SITE_OTHER): Payer: Self-pay | Admitting: Obstetrics & Gynecology

## 2014-12-14 NOTE — Progress Notes (Signed)
Cytotec 200 mcg  Disp # 6 0 rf   Take 3 pills po now and repeat in 8 hours, called in to [facility] per Dr.Horowitz orders. Patient instructed on how to take meds.

## 2014-12-29 ENCOUNTER — Ambulatory Visit (INDEPENDENT_AMBULATORY_CARE_PROVIDER_SITE_OTHER): Payer: BC Managed Care – PPO | Admitting: Obstetrics & Gynecology

## 2014-12-29 ENCOUNTER — Encounter (FREE_STANDING_LABORATORY_FACILITY)
Admit: 2014-12-29 | Discharge: 2014-12-29 | Disposition: A | Discharge status: Home / Self Care | Payer: BC Managed Care – PPO | Attending: Obstetrics & Gynecology | Admitting: Obstetrics & Gynecology

## 2014-12-29 DIAGNOSIS — N96 Recurrent pregnancy loss: Secondary | ICD-10-CM

## 2014-12-29 NOTE — Progress Notes (Signed)
No doing well s/p cytotek removal of POC.  Still spotting.  Focused Reproductive Endocrinology and Infertility Examination   Uterus:    Position: Midplane    Endometrial thickness:   8.6 mm    Endometrial pattern:  Repairing/menstrual    Uterine wall: Normal     Right Ovary:      Follicles:   1. Quiescent    Left Ovary:     Follicles:   1. Quiescent       Fluid:None  A: S/P missed abortion  Now to be evaluated for RPL.  Thrombophilia panel and karyotype to be sent. We discussed RPL at some length with possible causes and evaluation. With this patient,I discussed her dx,testing and plan of care.I spent a total of 10 min with her,with greater than 50% counsel and coordinating care.

## 2014-12-30 LAB — FACTOR 2 (GENE MUTATION): PROTHROMBIN GENE MUTATION PCR: NEGATIVE

## 2014-12-30 LAB — ANTITHROMBIN ACTIVITY, PLASMA: ANTITHROMBIN ACTIVITY: 118 % (ref 83–128)

## 2014-12-30 LAB — FACTOR 5 MUTATION (LEIDEN): FACTOR V GENE MUTATION PCR: NEGATIVE

## 2014-12-30 LAB — PROTEIN C ACTIVITY, PLASMA: PROTEIN C ACTIVITY: 117 % (ref 70–140)

## 2014-12-31 LAB — PHOSPHOLIPID (CARDIOLIPIN) IGG & IGM
PHOSPHOLIPID (CARDIOLIPIN) ANTIBODIES, IGG, SERUM: <4 [GPL'U]
PHOSPHOLIPID (CARDIOLIPIN) ANTIBODIES, IGM, SERUM: <4 [MPL'U]

## 2014-12-31 LAB — BETA-2 GLYCOPROTEIN 1 ANTIBODIES, IGG, IGM, SERUM: BETA-2 GLYCOPROTEIN 1 ANTIBODIES, IGG, SERUM: <4 U/mL

## 2015-01-07 LAB — MTHFR, C677T, MUTATION BLOOD

## 2015-01-11 ENCOUNTER — Telehealth (INDEPENDENT_AMBULATORY_CARE_PROVIDER_SITE_OTHER): Payer: Self-pay | Admitting: Obstetrics & Gynecology

## 2015-02-07 ENCOUNTER — Ambulatory Visit (INDEPENDENT_AMBULATORY_CARE_PROVIDER_SITE_OTHER): Payer: BC Managed Care – PPO | Admitting: Obstetrics & Gynecology

## 2015-02-07 DIAGNOSIS — N83209 Unspecified ovarian cyst, unspecified side: Principal | ICD-10-CM

## 2015-02-07 MED ORDER — NORGESTREL 0.3 MG-ETHINYL ESTRADIOL 30 MCG TABLET
1.0000 | ORAL_TABLET | Freq: Every day | ORAL | Status: DC
Start: 2015-02-07 — End: 2015-03-08

## 2015-02-07 NOTE — Progress Notes (Signed)
Irregular menses since loss. Has been bleeding intermittently, without real pain.  Focused Reproductive Endocrinology and Infertility Examination   Uterus:    Position: Midplane    Endometrial thickness:   6.74 mm    Endometrial pattern:  Mentrual    Uterine wall: Normal     Right Ovary:      Follicles:   1. Quiescent    Left Ovary:     Follicles:   1. 53.92 mm  2. 44.95 mm       Fluid:None  A: Hemorrhagic corpus luteum  P: Lo ovral X 1 pack  RTC after menses  Reassured  With this patient,I discussed her dx,testing and plan of care.I spent a total of 10 min with her,with greater than 50% counsel and coordinating care.

## 2015-02-08 ENCOUNTER — Other Ambulatory Visit (HOSPITAL_BASED_OUTPATIENT_CLINIC_OR_DEPARTMENT_OTHER): Payer: Self-pay

## 2015-02-08 DIAGNOSIS — Z8619 Personal history of other infectious and parasitic diseases: Secondary | ICD-10-CM

## 2015-02-09 NOTE — Telephone Encounter (Addendum)
Pt called requesting a refill of her valtrex. Pt last seen 01/25/14. Medication Pended. Moss McSarah C Kelechi Astarita, RN  02/09/2015, 10:23    From: Rhea Beltonourtney Jo Larcom  To: [name], [name], NP  Sent: 02/08/2015 7:24 PM EDT  Subject: Medication Renewal Request    Original authorizing provider: Carman ChingPamela Mariem, NP    Rhea Beltonourtney Jo Shawn would like a refill of the following medications:  valACYclovir (VALTREX) 500 mg Oral Tablet Carman Ching[[name] Chetara, NP]    Preferred pharmacy: [facility] - [place], [place] - [address]    Comment:

## 2015-02-11 ENCOUNTER — Other Ambulatory Visit (HOSPITAL_BASED_OUTPATIENT_CLINIC_OR_DEPARTMENT_OTHER): Payer: Self-pay

## 2015-02-11 NOTE — Telephone Encounter (Signed)
Anne Cummings pt. She is out of contact. Pt called requesting a refill of her valtrex. Pt last seen 01/25/14. Medication Pended. Moss McSarah C Curtis Cain, RN  02/11/2015, 12:45

## 2015-02-13 MED ORDER — VALACYCLOVIR 500 MG TABLET
500.0000 mg | ORAL_TABLET | Freq: Every day | ORAL | Status: DC
Start: 2015-02-13 — End: 2015-04-12

## 2015-02-13 NOTE — Telephone Encounter (Signed)
Approved Valtrex RX; Sarah, please notify patient. Carman ChingPamela Yvanna, NP 02/13/2015, 12:02

## 2015-02-26 ENCOUNTER — Other Ambulatory Visit: Payer: Self-pay

## 2015-03-08 ENCOUNTER — Ambulatory Visit (INDEPENDENT_AMBULATORY_CARE_PROVIDER_SITE_OTHER): Payer: MEDICAID | Admitting: Obstetrics & Gynecology

## 2015-03-08 VITALS — BP 128/78 | Wt 145.5 lb

## 2015-03-08 DIAGNOSIS — N970 Female infertility associated with anovulation: Secondary | ICD-10-CM

## 2015-03-08 MED ORDER — LETROZOLE 2.5 MG TABLET
5.0000 mg | ORAL_TABLET | Freq: Every day | ORAL | Status: AC
Start: 2015-03-08 — End: ?

## 2015-03-08 NOTE — Nurses Notes (Signed)
Pt reports a negative home pregnancy test

## 2015-03-08 NOTE — Progress Notes (Signed)
Now Day 5 of cycle. Moving to [place] in 6 days. No specific complaints.  Focused Reproductive Endocrinology and Infertility Examination   Uterus:    Position: Midplane    Endometrial thickness:   6.36  mm    Endometrial pattern:  Menstural    Uterine wall: Normal     Right Ovary:      Follicles:   1. Quiescent    Left Ovary:     Follicles:   1. Quiescent       Fluid:None    ASSESSMENT    ICD-10-CM    1. 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 For COH  P: Letrozole 5 mg po q days 5-9  Given addresses of MDs in [place] who can take care of her on the far side of move. With this patient,I discussed her dx,testing and plan of care.I spent a total of 10 min with her,with greater than 50% counsel and coordinating care.

## 2015-04-12 ENCOUNTER — Other Ambulatory Visit (HOSPITAL_BASED_OUTPATIENT_CLINIC_OR_DEPARTMENT_OTHER): Payer: Self-pay

## 2015-04-12 DIAGNOSIS — Z8619 Personal history of other infectious and parasitic diseases: Secondary | ICD-10-CM

## 2015-04-12 MED ORDER — VALACYCLOVIR 500 MG TABLET
500.0000 mg | ORAL_TABLET | Freq: Every day | ORAL | 0 refills | Status: AC
Start: 2015-04-12 — End: ?

## 2015-04-12 NOTE — Telephone Encounter (Signed)
Last seen: 2015   Approved Valtrex 500 mg RX  #90 only; Sarah, please notify patient and tell her no further refills until I see her for Annual GYN appt.   Carman ChingPamela Lasharn, NP 04/12/2015, 23:36

## 2015-04-12 NOTE — Telephone Encounter (Addendum)
Pt called requesting a refill of her valtrex. Pt last seen 01/25/14. Medication Pended. [name], RN  04/12/2015, 15:07    From: [name]  To: [name], [name], NP  Sent: 04/12/2015 2:44 PM EST  Subject: Medication Renewal Request    Original authorizing provider: Carman ChingPamela Felipe, NP    Rhea Beltonourtney Jo Balon would like a refill of the following medications:  valACYclovir (VALTREX) 500 mg Oral Tablet Carman Ching[[name] Lilyana, NP]    Preferred pharmacy: [facility] - [place], [place] - [address] AT [address]    Comment:

## 2015-06-10 ENCOUNTER — Emergency Department (HOSPITAL_BASED_OUTPATIENT_CLINIC_OR_DEPARTMENT_OTHER)
Admission: EM | Admit: 2015-06-10 | Discharge: 2015-06-10 | Disposition: A | Discharge status: Home / Self Care | Payer: Self-pay | Attending: Physician Assistant | Admitting: Physician Assistant

## 2015-06-10 ENCOUNTER — Encounter (HOSPITAL_BASED_OUTPATIENT_CLINIC_OR_DEPARTMENT_OTHER): Payer: Self-pay | Admitting: *Deleted

## 2015-06-10 ENCOUNTER — Emergency Department (HOSPITAL_BASED_OUTPATIENT_CLINIC_OR_DEPARTMENT_OTHER): Payer: Self-pay

## 2015-06-10 DIAGNOSIS — Z3202 Encounter for pregnancy test, result negative: Secondary | ICD-10-CM | POA: Insufficient documentation

## 2015-06-10 DIAGNOSIS — J45909 Unspecified asthma, uncomplicated: Secondary | ICD-10-CM | POA: Insufficient documentation

## 2015-06-10 DIAGNOSIS — B349 Viral infection, unspecified: Secondary | ICD-10-CM | POA: Insufficient documentation

## 2015-06-10 DIAGNOSIS — Z79899 Other long term (current) drug therapy: Secondary | ICD-10-CM | POA: Insufficient documentation

## 2015-06-10 DIAGNOSIS — F17200 Nicotine dependence, unspecified, uncomplicated: Secondary | ICD-10-CM | POA: Insufficient documentation

## 2015-06-10 DIAGNOSIS — Z880 Allergy status to penicillin: Secondary | ICD-10-CM | POA: Insufficient documentation

## 2015-06-10 DIAGNOSIS — R109 Unspecified abdominal pain: Secondary | ICD-10-CM | POA: Insufficient documentation

## 2015-06-10 HISTORY — DX: Unspecified asthma, uncomplicated: J45.909

## 2015-06-10 LAB — COMPREHENSIVE METABOLIC PANEL
ALT: 16 U/L (ref 14–54)
AST: 17 U/L (ref 15–41)
Albumin: 4.3 g/dL (ref 3.5–5.0)
Alkaline Phosphatase: 56 U/L (ref 38–126)
Anion gap: 8 (ref 5–15)
BUN: 10 mg/dL (ref 6–20)
CO2: 27 mmol/L (ref 22–32)
Calcium: 9.1 mg/dL (ref 8.9–10.3)
Chloride: 104 mmol/L (ref 101–111)
Creatinine, Ser: 0.44 mg/dL (ref 0.44–1.00)
GFR calc Af Amer: >60 mL/min (ref >60)
GFR calc non Af Amer: >60 mL/min (ref >60)
Glucose, Bld: 107 mg/dL — ABNORMAL HIGH (ref 65–99)
Potassium: 3.7 mmol/L (ref 3.5–5.1)
Sodium: 139 mmol/L (ref 135–145)
Total Bilirubin: 0.5 mg/dL (ref 0.3–1.2)
Total Protein: 8 g/dL (ref 6.5–8.1)

## 2015-06-10 LAB — URINALYSIS, ROUTINE W REFLEX MICROSCOPIC
Bilirubin Urine: NEGATIVE
Glucose, UA: NEGATIVE mg/dL
Ketones, ur: NEGATIVE mg/dL
Leukocytes, UA: NEGATIVE
Nitrite: NEGATIVE
Protein, ur: NEGATIVE mg/dL
Specific Gravity, Urine: 1.022 (ref 1.005–1.030)
pH: 8 (ref 5.0–8.0)

## 2015-06-10 LAB — CBC WITH DIFFERENTIAL/PLATELET
Basophils Absolute: 0 10*3/uL (ref 0.0–0.1)
Basophils Relative: 0 %
Eosinophils Absolute: 0.1 10*3/uL (ref 0.0–0.7)
Eosinophils Relative: 1 %
HCT: 38.8 % (ref 36.0–46.0)
Hemoglobin: 12.6 g/dL (ref 12.0–15.0)
Lymphocytes Relative: 32 %
Lymphs Abs: 3.1 10*3/uL (ref 0.7–4.0)
MCH: 28.6 pg (ref 26.0–34.0)
MCHC: 32.5 g/dL (ref 30.0–36.0)
MCV: 88 fL (ref 78.0–100.0)
Monocytes Absolute: 0.6 10*3/uL (ref 0.1–1.0)
Monocytes Relative: 6 %
Neutro Abs: 5.9 10*3/uL (ref 1.7–7.7)
Neutrophils Relative %: 61 %
Platelets: 415 10*3/uL — ABNORMAL HIGH (ref 150–400)
RBC: 4.41 MIL/uL (ref 3.87–5.11)
RDW: 13 % (ref 11.5–15.5)
WBC: 9.7 10*3/uL (ref 4.0–10.5)

## 2015-06-10 LAB — LIPASE, BLOOD: Lipase: 18 U/L (ref 11–51)

## 2015-06-10 LAB — URINE MICROSCOPIC-ADD ON: WBC, UA: NONE SEEN WBC/hpf (ref 0–5)

## 2015-06-10 LAB — PREGNANCY, URINE: Preg Test, Ur: NEGATIVE

## 2015-06-10 MED ORDER — SODIUM CHLORIDE 0.9 % IV BOLUS (SEPSIS)
1000.0000 mL | Freq: Once | INTRAVENOUS | Status: AC
  Administered 2015-06-10: 1000 mL via INTRAVENOUS

## 2015-06-10 MED ORDER — OXYCODONE-ACETAMINOPHEN 5-325 MG PO TABS
1.0000 | ORAL_TABLET | Freq: Once | ORAL | Status: AC
  Administered 2015-06-10: 1 via ORAL
  Filled 2015-06-10: qty 1

## 2015-06-10 MED ORDER — OXYCODONE-ACETAMINOPHEN 5-325 MG PO TABS: 1.0000 | ORAL_TABLET | Freq: Four times a day (QID) | ORAL | Status: DC | PRN

## 2015-06-10 MED FILL — OXYCODONE/APAP 5-325: 5-325 | 2 days supply | Qty: 7 | Fill #0

## 2015-06-10 NOTE — Discharge Instructions (Signed)
We are unsure what is causing your muscle pain. Please use pain medication and Benadryl as needed. Please follow-up with your primary care physician on Monday. You may need to have a workup for rheumatology as an outpatient. Please return with fever or any other concerning symptoms.

## 2015-06-10 NOTE — ED Provider Notes (Signed)
CSN: [id number]     Arrival date & time 06/10/15  1026 History   First MD Initiated Contact with Patient 06/10/15 1259     Chief Complaint  Patient presents with  . Pruritis     (Consider location/radiation/quality/duration/timing/severity/associated sxs/prior Treatment) HPI   Patient is a 30 year old female presenting with multiple complaints. Patient starts that she states that she started itching several days ago. She says she itches from her toes already up to her head. She has noticed no rash. She's has no fever. Patient also reports joint pains all over. She describes it as the "the same feeling as after laparoscopy". She reports mild headache. Mild nausea. Gassy feeling. 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 Asthma    Past Surgical History  Procedure Laterality Date  . Cesarean section    . Breast surgery    . Hernia repair    . Fallopian tube removal     No family history on file. Social History  Substance Use Topics  . Smoking status: Current Some Day Smoker  . Smokeless tobacco: Never Used  . Alcohol Use: Yes     Comment: occasional   OB History    No data available     Review of Systems  Constitutional: Positive for fatigue. Negative for activity change.  HENT: Negative for congestion.   Eyes: Negative for discharge.  Respiratory: Negative for shortness of breath.   Cardiovascular: Negative for chest pain.  Gastrointestinal: Positive for abdominal pain.  Genitourinary: Negative for dysuria.  Musculoskeletal: Negative for back pain.  Psychiatric/Behavioral: Negative for agitation.      Allergies  Nsaids and Penicillins  Home Medications   Prior to Admission medications   Medication Sig Start Date End Date Taking? Authorizing Provider  valACYclovir (VALTREX) 500 MG tablet Take 500 mg by mouth daily.   Yes Historical Provider, MD  oxyCODONE-acetaminophen (PERCOCET/ROXICET) 5-325 MG tablet  Take 1 tablet by mouth every 6 (six) hours as needed for severe pain. 06/10/15   Courteney Lyn Mackuen, MD   BP 125/79 mmHg  Pulse 57  Temp(Src) 98 F (36.7 C) (Oral)  Resp 18  Ht 5' (1.524 m)  Wt 135 lb (61.236 kg)  BMI 26.37 kg/m2  SpO2 100%  LMP 06/04/2015 Physical Exam  Constitutional: She is oriented to person, place, and time. She appears well-developed and well-nourished.  HENT:  Head: Normocephalic and atraumatic.  Eyes: Conjunctivae are normal. Right eye exhibits no discharge.  Neck: Neck supple.  Cardiovascular: Normal rate, regular rhythm and normal heart sounds.   No murmur heard. Pulmonary/Chest: Effort normal and breath sounds normal. She has no wheezes. She has no rales.  Abdominal: Soft. She exhibits no distension. There is no tenderness.  Musculoskeletal: Normal range of motion. She exhibits no edema.  Neurological: She is oriented to person, place, and time. No cranial nerve deficit.  Skin: Skin is warm and dry. No rash noted. She is not diaphoretic.  Psychiatric: She has a normal mood and affect. Her behavior is normal.  Nursing note and vitals reviewed.   ED Course  Procedures (including critical care time) Labs Review Labs Reviewed  COMPREHENSIVE METABOLIC PANEL - Abnormal; Notable for the following:    Glucose, Bld 107 (*)    All other components within normal limits  CBC WITH DIFFERENTIAL/PLATELET - Abnormal; Notable for the following:    Platelets 415 (*)    All other components within normal  limits  URINALYSIS, ROUTINE W REFLEX MICROSCOPIC (NOT AT [facility]) - Abnormal; Notable for the following:    APPearance TURBID (*)    Hgb urine dipstick MODERATE (*)    All other components within normal limits  URINE MICROSCOPIC-ADD ON - Abnormal; Notable for the following:    Squamous Epithelial / LPF 0-5 (*)    Bacteria, UA FEW (*)    All other components within normal limits  LIPASE, BLOOD  PREGNANCY, URINE    Imaging Review Dg Chest 2 View  06/10/2015   CLINICAL DATA:  Two day history of chest pain. Urticaria for 2 days. EXAM: CHEST  2 VIEW COMPARISON:  None. FINDINGS: Lungs are clear. Heart size and pulmonary vascularity are normal. No adenopathy. No bone lesions. No pneumothorax. IMPRESSION: No edema or consolidation Electronically Signed   By: Bretta Bang III M.D.   On: 06/10/2015 14:24   I have personally reviewed and evaluated these images and lab results as part of my medical decision-making.   EKG Interpretation None      MDM   Final diagnoses:  Viral syndrome    Patient is a 30 year old female presenting with multiple complaints. Patient feels that she is in a lot of pain right now. Patient allergic to NSAIDs and penicillins. Patient states that she has pain all over and every single joint. She has itching. However she's got no rash. No visible joint swelling. Her physical exam is all normal. Her vital signs are normal. Both patient and mom are very concerned.    We will do baseline lab work, chest x-ray UA to rule out any kind infection. I suspect some type of viral illness. I anticipate that these studies will be negative and we will be discharging patient home with follow-up with her primary care physician.   Patietn told that this could be a start of a rheumatologic process and to follow-up with primary care physician if this extends beyond the next couple days.   3:28 PM ALl labs and xray and UA normal. Still normal vitals and physical exam. Taking PO. Will have her follow up with PCP.   Courteney Randall An, MD 06/10/15 1528

## 2015-06-10 NOTE — ED Notes (Signed)
Pt reports itching since Wednesday- states she now has joint pain. States she has bruising where she has been scratching

## 2015-06-10 NOTE — ED Notes (Signed)
Pt unable to urinate at this time.  

## 2015-06-14 ENCOUNTER — Ambulatory Visit (INDEPENDENT_AMBULATORY_CARE_PROVIDER_SITE_OTHER): Payer: Self-pay | Admitting: Physician Assistant

## 2015-06-14 ENCOUNTER — Encounter: Payer: Self-pay | Admitting: Physician Assistant

## 2015-06-14 ENCOUNTER — Ambulatory Visit (HOSPITAL_BASED_OUTPATIENT_CLINIC_OR_DEPARTMENT_OTHER)
Admission: RE | Admit: 2015-06-14 | Discharge: 2015-06-14 | Disposition: A | Discharge status: Home / Self Care | Payer: Self-pay | Source: Ambulatory Visit | Attending: Physician Assistant | Admitting: Physician Assistant

## 2015-06-14 VITALS — BP 122/86 | HR 89 | Temp 98.2°F | Ht 59.75 in | Wt 145.0 lb

## 2015-06-14 DIAGNOSIS — R109 Unspecified abdominal pain: Secondary | ICD-10-CM | POA: Insufficient documentation

## 2015-06-14 DIAGNOSIS — N200 Calculus of kidney: Secondary | ICD-10-CM | POA: Insufficient documentation

## 2015-06-14 DIAGNOSIS — M609 Myositis, unspecified: Secondary | ICD-10-CM

## 2015-06-14 DIAGNOSIS — M791 Myalgia: Secondary | ICD-10-CM

## 2015-06-14 DIAGNOSIS — K5900 Constipation, unspecified: Secondary | ICD-10-CM

## 2015-06-14 DIAGNOSIS — IMO0001 Reserved for inherently not codable concepts without codable children: Secondary | ICD-10-CM

## 2015-06-14 DIAGNOSIS — L299 Pruritus, unspecified: Secondary | ICD-10-CM

## 2015-06-14 LAB — CBC WITH DIFFERENTIAL/PLATELET
Basophils Absolute: 0 10*3/uL (ref 0.0–0.1)
Basophils Relative: 0.3 % (ref 0.0–3.0)
Eosinophils Absolute: 0.1 10*3/uL (ref 0.0–0.7)
Eosinophils Relative: 0.6 % (ref 0.0–5.0)
HCT: 43 % (ref 36.0–46.0)
Hemoglobin: 14.6 g/dL (ref 12.0–15.0)
Lymphocytes Relative: 30.1 % (ref 12.0–46.0)
Lymphs Abs: 3.2 10*3/uL (ref 0.7–4.0)
MCHC: 33.9 g/dL (ref 30.0–36.0)
MCV: 86.7 fl (ref 78.0–100.0)
Monocytes Absolute: 0.8 10*3/uL (ref 0.1–1.0)
Monocytes Relative: 7 % (ref 3.0–12.0)
Neutro Abs: 6.6 10*3/uL (ref 1.4–7.7)
Neutrophils Relative %: 62 % (ref 43.0–77.0)
Platelets: 443 10*3/uL — ABNORMAL HIGH (ref 150.0–400.0)
RBC: 4.96 Mil/uL (ref 3.87–5.11)
RDW: 14.3 % (ref 11.5–15.5)
WBC: 10.6 10*3/uL — ABNORMAL HIGH (ref 4.0–10.5)

## 2015-06-14 LAB — COMPREHENSIVE METABOLIC PANEL
ALT: 14 U/L (ref 0–35)
AST: 17 U/L (ref 0–37)
Albumin: 4.8 g/dL (ref 3.5–5.2)
Alkaline Phosphatase: 60 U/L (ref 39–117)
BUN: 11 mg/dL (ref 6–23)
CO2: 27 mEq/L (ref 19–32)
Calcium: 9.8 mg/dL (ref 8.4–10.5)
Chloride: 98 mEq/L (ref 96–112)
Creatinine, Ser: 0.6 mg/dL (ref 0.40–1.20)
GFR: 123.83 mL/min (ref >60.00)
Glucose, Bld: 95 mg/dL (ref 70–99)
Potassium: 4.2 mEq/L (ref 3.5–5.1)
Sodium: 136 mEq/L (ref 135–145)
Total Bilirubin: 0.8 mg/dL (ref 0.2–1.2)
Total Protein: 8.7 g/dL — ABNORMAL HIGH (ref 6.0–8.3)

## 2015-06-14 LAB — T4, FREE: Free T4: 1.02 ng/dL (ref 0.60–1.60)

## 2015-06-14 LAB — SEDIMENTATION RATE: Sed Rate: 21 mm/h (ref 0–22)

## 2015-06-14 LAB — CK: Total CK: 30 U/L (ref 7–177)

## 2015-06-14 LAB — TSH: TSH: 1.8 u[IU]/mL (ref 0.35–4.50)

## 2015-06-14 MED ORDER — HYDROXYZINE HCL 10 MG PO TABS: 10.0000 mg | ORAL_TABLET | Freq: Three times a day (TID) | ORAL | Status: DC | PRN

## 2015-06-14 MED ORDER — METHYLPREDNISOLONE 4 MG PO TBPK: ORAL_TABLET | ORAL | Status: DC

## 2015-06-14 MED ORDER — HYDROCODONE-ACETAMINOPHEN 10-325 MG PO TABS: 1.0000 | ORAL_TABLET | Freq: Three times a day (TID) | ORAL | Status: DC | PRN

## 2015-06-14 MED FILL — HYDROCODON-APAP 10-325: 10-325 | 10 days supply | Qty: 30 | Fill #0

## 2015-06-14 NOTE — Progress Notes (Signed)
Patient presents to clinic today to establish care. Patient was seen in the ER on 06/10/15 with complaints of joint pains in all joints along with significant widespread pruritus. Workup included the following -- CBC, CMP, UA, Urine Micro, Lipase, Urine pregnancy and CXR obtained -- all unremarkable. Patient endorses continued widespread aches along with pruritus. Is now having constipation and stomach pain after taking the Percocet. Took last dose 2 days ago. Denies nausea, vomiting, heart burn on indigestions. Last BM 48 hours ago. Endorses some decreased ability to pass gas. Denies any change to urination.  Patient denies new medications or supplements. Endorses family history of erythema nodosum (sister) but denies other known family history of rheumatological or inflammatory condition.   Past Medical History  Diagnosis Date  . Asthma   . Chicken pox     Past Surgical History  Procedure Laterality Date  . Cesarean section    . Breast surgery    . Hernia repair    . Fallopian tube removal      Current Outpatient Prescriptions on File Prior to Visit  Medication Sig Dispense Refill  . valACYclovir (VALTREX) 500 MG tablet Take 500 mg by mouth daily.    Marland Kitchen oxyCODONE-acetaminophen (PERCOCET/ROXICET) 5-325 MG tablet Take 1 tablet by mouth every 6 (six) hours as needed for severe pain. (Patient not taking: Reported on 06/14/2015) 7 tablet 0   No current facility-administered medications on file prior to visit.    Allergies  Allergen Reactions  . Nsaids Anaphylaxis  . Penicillins Rash    Family History  Problem Relation Age of Onset  . Hypertension Mother   . Hyperlipidemia Father   . Hypertension Father   . Diabetes Maternal Aunt   . Heart disease Maternal Aunt   . Hyperlipidemia Maternal Aunt   . Heart disease Maternal Uncle   . Hyperlipidemia Maternal Uncle   . Hypertension Paternal Uncle   . Arthritis Maternal Grandmother   . Stroke Maternal Grandmother   . Hypertension  Maternal Grandmother   . Hyperlipidemia Maternal Grandfather   . Heart disease Maternal Grandfather   . Cancer Paternal Grandmother     Breast  . Diabetes Paternal Grandmother   . Cancer Paternal Health visitor  . Stroke Paternal Grandfather     Social History   Social History  . Marital Status: Married    Spouse Name: N/A  . Number of Children: N/A  . Years of Education: N/A   Occupational History  . Not on file.   Social History Main Topics  . Smoking status: Current Some Day Smoker  . Smokeless tobacco: Never Used  . Alcohol Use: Yes     Comment: occasional  . Drug Use: No  . Sexual Activity: Yes    Birth Control/ Protection: None   Other Topics Concern  . Not on file   Social History Narrative   Review of Systems  Constitutional: Positive for malaise/fatigue. Negative for fever.  Eyes: Negative for blurred vision.  Respiratory: Negative for cough and shortness of breath.   Cardiovascular: Negative for chest pain and palpitations.  Gastrointestinal: Positive for constipation. Negative for heartburn, nausea, vomiting, abdominal pain, diarrhea, blood in stool and melena.  Genitourinary: Negative for dysuria, urgency, frequency, hematuria and flank pain.  Musculoskeletal: Positive for myalgias and joint pain. Negative for back pain, falls and neck pain.  Skin: Positive for itching. Negative for rash.  Neurological: Negative for dizziness, loss of consciousness and headaches.  Psychiatric/Behavioral: Negative for depression  and suicidal ideas. The patient is not nervous/anxious and does not have insomnia.    BP 122/86 mmHg  Pulse 89  Temp(Src) 98.2 F (36.8 C) (Oral)  Ht 4' 11.75" (1.518 m)  Wt 145 lb (65.772 kg)  BMI 28.54 kg/m2  SpO2 100%  LMP 06/04/2015  Physical Exam  Constitutional: She is oriented to person, place, and time and well-developed, well-nourished, and in no distress.  HENT:  Head: Normocephalic and atraumatic.  Eyes:  Conjunctivae are normal.  Neck: Neck supple.  Cardiovascular: Normal rate, regular rhythm, normal heart sounds and intact distal pulses.   Pulmonary/Chest: Effort normal and breath sounds normal. No respiratory distress. She has no wheezes. She has no rales. She exhibits no tenderness.  Abdominal: Soft. Bowel sounds are normal. She exhibits no distension and no mass. There is no rebound and no guarding.  Generalized tenderness without focal tenderness, rebound or guarding. BS present in all 4 quadrants but slightly diminished.   Musculoskeletal:  Systemic myalgias without noted swelling/erythema, decreased ROM or strength. Worse in calves and biceps bilaterally.  Neurological: She is alert and oriented to person, place, and time.  Skin: Skin is warm and dry. No rash noted.  Psychiatric: Affect normal.  Vitals reviewed.   Recent Results (from the past 2160 hour(s))  Comprehensive metabolic panel     Status: Abnormal   Collection Time: 06/10/15  2:40 PM  Result Value Ref Range   Sodium 139 135 - 145 mmol/L   Potassium 3.7 3.5 - 5.1 mmol/L   Chloride 104 101 - 111 mmol/L   CO2 27 22 - 32 mmol/L   Glucose, Bld 107 (H) 65 - 99 mg/dL   BUN 10 6 - 20 mg/dL   Creatinine, Ser 0.44 0.44 - 1.00 mg/dL   Calcium 9.1 8.9 - 10.3 mg/dL   Total Protein 8.0 6.5 - 8.1 g/dL   Albumin 4.3 3.5 - 5.0 g/dL   AST 17 15 - 41 U/L   ALT 16 14 - 54 U/L   Alkaline Phosphatase 56 38 - 126 U/L   Total Bilirubin 0.5 0.3 - 1.2 mg/dL   GFR calc non Af Amer >60 >60 mL/min   GFR calc Af Amer >60 >60 mL/min    Comment: (NOTE) The eGFR has been calculated using the CKD EPI equation. This calculation has not been validated in all clinical situations. eGFR's persistently <60 mL/min signify possible Chronic Kidney Disease.    Anion gap 8 5 - 15  CBC with Differential/Platelet     Status: Abnormal   Collection Time: 06/10/15  2:40 PM  Result Value Ref Range   WBC 9.7 4.0 - 10.5 K/uL   RBC 4.41 3.87 - 5.11 MIL/uL     Hemoglobin 12.6 12.0 - 15.0 g/dL   HCT 38.8 36.0 - 46.0 %   MCV 88.0 78.0 - 100.0 fL   MCH 28.6 26.0 - 34.0 pg   MCHC 32.5 30.0 - 36.0 g/dL   RDW 13.0 11.5 - 15.5 %   Platelets 415 (H) 150 - 400 K/uL   Neutrophils Relative % 61 %   Neutro Abs 5.9 1.7 - 7.7 K/uL   Lymphocytes Relative 32 %   Lymphs Abs 3.1 0.7 - 4.0 K/uL   Monocytes Relative 6 %   Monocytes Absolute 0.6 0.1 - 1.0 K/uL   Eosinophils Relative 1 %   Eosinophils Absolute 0.1 0.0 - 0.7 K/uL   Basophils Relative 0 %   Basophils Absolute 0.0 0.0 - 0.1 K/uL  Lipase,  blood     Status: None   Collection Time: 06/10/15  2:40 PM  Result Value Ref Range   Lipase 18 11 - 51 U/L  Urinalysis, Routine w reflex microscopic (not at [facility])     Status: Abnormal   Collection Time: 06/10/15  2:40 PM  Result Value Ref Range   Color, Urine YELLOW YELLOW   APPearance TURBID (A) CLEAR   Specific Gravity, Urine 1.022 1.005 - 1.030   pH 8.0 5.0 - 8.0   Glucose, UA NEGATIVE NEGATIVE mg/dL   Hgb urine dipstick MODERATE (A) NEGATIVE   Bilirubin Urine NEGATIVE NEGATIVE   Ketones, ur NEGATIVE NEGATIVE mg/dL   Protein, ur NEGATIVE NEGATIVE mg/dL   Nitrite NEGATIVE NEGATIVE   Leukocytes, UA NEGATIVE NEGATIVE  Pregnancy, urine     Status: None   Collection Time: 06/10/15  2:40 PM  Result Value Ref Range   Preg Test, Ur NEGATIVE NEGATIVE    Comment:        THE SENSITIVITY OF THIS METHODOLOGY IS >20 mIU/mL.   Urine microscopic-add on     Status: Abnormal   Collection Time: 06/10/15  2:40 PM  Result Value Ref Range   Squamous Epithelial / LPF 0-5 (A) NONE SEEN   WBC, UA NONE SEEN 0 - 5 WBC/hpf   RBC / HPF 0-5 0 - 5 RBC/hpf   Bacteria, UA FEW (A) NONE SEEN   Urine-Other AMORPHOUS URATES/PHOSPHATES   CBC w/Diff     Status: Abnormal   Collection Time: 06/14/15  2:26 PM  Result Value Ref Range   WBC 10.6 (H) 4.0 - 10.5 K/uL   RBC 4.96 3.87 - 5.11 Mil/uL   Hemoglobin 14.6 12.0 - 15.0 g/dL   HCT 43.0 36.0 - 46.0 %   MCV 86.7 78.0 -  100.0 fl   MCHC 33.9 30.0 - 36.0 g/dL   RDW 14.3 11.5 - 15.5 %   Platelets 443.0 (H) 150.0 - 400.0 K/uL   Neutrophils Relative % 62.0 43.0 - 77.0 %   Lymphocytes Relative 30.1 12.0 - 46.0 %   Monocytes Relative 7.0 3.0 - 12.0 %   Eosinophils Relative 0.6 0.0 - 5.0 %   Basophils Relative 0.3 0.0 - 3.0 %   Neutro Abs 6.6 1.4 - 7.7 K/uL   Lymphs Abs 3.2 0.7 - 4.0 K/uL   Monocytes Absolute 0.8 0.1 - 1.0 K/uL   Eosinophils Absolute 0.1 0.0 - 0.7 K/uL   Basophils Absolute 0.0 0.0 - 0.1 K/uL  Sed Rate (ESR)     Status: None   Collection Time: 06/14/15  2:26 PM  Result Value Ref Range   Sed Rate 21 0 - 22 mm/hr  HIV antibody     Status: None   Collection Time: 06/14/15  2:26 PM  Result Value Ref Range   HIV 1&2 Ab, 4th Generation NONREACTIVE NONREACTIVE    Comment:   HIV-1 antigen and HIV-1/HIV-2 antibodies were not detected.  There is no laboratory evidence of HIV infection.   HIV-1/2 Antibody Diff        Not indicated. HIV-1 RNA, Qual TMA          Not indicated.     PLEASE NOTE: This information has been disclosed to you from records whose confidentiality may be protected by state law. If your state requires such protection, then the state law prohibits you from making any further disclosure of the information without the specific written consent of the person to whom it pertains, or as otherwise permitted  by law. A general authorization for the release of medical or other information is NOT sufficient for this purpose.   The performance of this assay has not been clinically validated in patients less than 79 years old.   For additional information please refer to http://education.[facility].com/faq/FAQ106.  (This link is being provided for informational/educational purposes only.)     Comp Met (CMET)     Status: Abnormal   Collection Time: 06/14/15  2:26 PM  Result Value Ref Range   Sodium 136 135 - 145 mEq/L   Potassium 4.2 3.5 - 5.1 mEq/L   Chloride 98 96 - 112  mEq/L   CO2 27 19 - 32 mEq/L   Glucose, Bld 95 70 - 99 mg/dL   BUN 11 6 - 23 mg/dL   Creatinine, Ser 0.60 0.40 - 1.20 mg/dL   Total Bilirubin 0.8 0.2 - 1.2 mg/dL   Alkaline Phosphatase 60 39 - 117 U/L   AST 17 0 - 37 U/L   ALT 14 0 - 35 U/L   Total Protein 8.7 (H) 6.0 - 8.3 g/dL   Albumin 4.8 3.5 - 5.2 g/dL   Calcium 9.8 8.4 - 10.5 mg/dL   GFR 123.83 >60.00 mL/min  TSH     Status: None   Collection Time: 06/14/15  2:26 PM  Result Value Ref Range   TSH 1.80 0.35 - 4.50 uIU/mL  T4, free     Status: None   Collection Time: 06/14/15  2:26 PM  Result Value Ref Range   Free T4 1.02 0.60 - 1.60 ng/dL  CK (Creatine Kinase)     Status: None   Collection Time: 06/14/15  2:26 PM  Result Value Ref Range   Total CK 30 7 - 177 U/L    Assessment/Plan: Constipation BS present but diminished. Will check x-ray abdomen today. Bowel regimen given. Will check lab panel today.  Myalgia and myositis Unclear etiology. Sudden onset without obvious trigger. Musccular tenderness of extremities noted but no other abnormal findings. Will obtain panel to include CBC w diff, CK, CMP, ESR.   Pruritus Unclear etiology. No rash. No obvious trigger. Question stress from new job as a trigger. Will repeat CBC, CMP. Will check CK, HIV ab, ESR and Thyroid panel. Rx Medrol pack and Vistaril. Supportive measures reviewed. Follow-up next week.

## 2015-06-14 NOTE — Progress Notes (Signed)
Pre visit review using our clinic review tool, if applicable. No additional management support is needed unless otherwise documented below in the visit note. 

## 2015-06-14 NOTE — Patient Instructions (Signed)
Please go to the labs. I will call you with your results.  Please go downstairs for x-ray. I will call you quickly with results.  Please take the Prednisone and Hydroxyzine as directed. Cool compresses will be beneficial.  Use the Hydrocodone sparingly.  Follow the bowel regimen below:  I encourage you to increase hydration and the amount of fiber in your diet.  Start a daily probiotic (Align, Culturelle, Digestive Advantage, etc.). If no bowel movement within 24 hours, take 2 Tbs of Milk of Magnesia in a 4 oz glass of warmed prune juice every 2-3 days to help promote bowel movement. If no results within 24 hours, then repeat above regimen, adding a Dulcolax stool softener to regimen. If this does not promote a bowel movement, please call the office.  Follow-up next monday

## 2015-06-15 ENCOUNTER — Telehealth: Payer: Self-pay | Admitting: *Deleted

## 2015-06-15 DIAGNOSIS — L299 Pruritus, unspecified: Secondary | ICD-10-CM | POA: Insufficient documentation

## 2015-06-15 DIAGNOSIS — IMO0001 Reserved for inherently not codable concepts without codable children: Secondary | ICD-10-CM | POA: Insufficient documentation

## 2015-06-15 DIAGNOSIS — K5900 Constipation, unspecified: Secondary | ICD-10-CM | POA: Insufficient documentation

## 2015-06-15 LAB — HIV ANTIBODY (ROUTINE TESTING W REFLEX): HIV 1&2 Ab, 4th Generation: NONREACTIVE

## 2015-06-15 NOTE — Assessment & Plan Note (Signed)
Unclear etiology. No rash. No obvious trigger. Question stress from new job as a trigger. Will repeat CBC, CMP. Will check CK, HIV ab, ESR and Thyroid panel. Rx Medrol pack and Vistaril. Supportive measures reviewed. Follow-up next week.

## 2015-06-15 NOTE — Telephone Encounter (Signed)
-----   Message from Waldon Merl, PA-C sent at 06/15/2015 12:30 PM EST ----- Labs all look good. No concerning findings but nothing to explain symptoms. May need to consider referral to a Rheumatologist for further assessment. Have her continue medications given at visit and follow-up with me next Monday so we can treat further and discuss need for referral.

## 2015-06-15 NOTE — Telephone Encounter (Addendum)
Called and spoke with the pt and informed her of recent lab results and note.  Pt verbalized understanding and agreed.  Pt was scheduled a follow-up appt for (Mon-06/20/15 @ 3:30pm).  Pt requested a work note to be sent by [employer]. Letter sent.//AB/CMA

## 2015-06-15 NOTE — Assessment & Plan Note (Signed)
Unclear etiology. Sudden onset without obvious trigger. Musccular tenderness of extremities noted but no other abnormal findings. Will obtain panel to include CBC w diff, CK, CMP, ESR.

## 2015-06-15 NOTE — Assessment & Plan Note (Signed)
BS present but diminished. Will check x-ray abdomen today. Bowel regimen given. Will check lab panel today.

## 2015-06-20 ENCOUNTER — Encounter: Payer: Self-pay | Admitting: Physician Assistant

## 2015-06-20 ENCOUNTER — Ambulatory Visit (INDEPENDENT_AMBULATORY_CARE_PROVIDER_SITE_OTHER): Payer: Self-pay | Admitting: Physician Assistant

## 2015-06-20 VITALS — BP 115/68 | HR 93 | Temp 98.1°F | Ht 59.75 in | Wt 147.8 lb

## 2015-06-20 DIAGNOSIS — IMO0001 Reserved for inherently not codable concepts without codable children: Secondary | ICD-10-CM

## 2015-06-20 DIAGNOSIS — M791 Myalgia: Secondary | ICD-10-CM

## 2015-06-20 DIAGNOSIS — M609 Myositis, unspecified: Secondary | ICD-10-CM

## 2015-06-20 DIAGNOSIS — L299 Pruritus, unspecified: Secondary | ICD-10-CM

## 2015-06-20 MED ORDER — HYDROXYZINE HCL 25 MG PO TABS: 25.0000 mg | ORAL_TABLET | Freq: Three times a day (TID) | ORAL | Status: DC | PRN

## 2015-06-20 MED ORDER — HYDROCODONE-ACETAMINOPHEN 10-325 MG PO TABS: 1.0000 | ORAL_TABLET | Freq: Three times a day (TID) | ORAL | Status: DC | PRN

## 2015-06-20 NOTE — Progress Notes (Signed)
 Patient presents to clinic today for follow-up regarding pruritus. Patient endorses taking her pain medication as directed with improvement in pain. Endorses myalgias are improving. Is taking Atarax with some improvement in itch but the itch is still present. Denies recurrence of hives or rash. Denies new symptoms.  Past Medical History  Diagnosis Date  . Asthma   . Chicken pox     Current Outpatient Prescriptions on File Prior to Visit  Medication Sig Dispense Refill  . Multiple Vitamin (MULTIVITAMIN) capsule Take 1 capsule by mouth daily.    . valACYclovir (VALTREX) 500 MG tablet Take 500 mg by mouth daily.     No current facility-administered medications on file prior to visit.    Allergies  Allergen Reactions  . Nsaids Anaphylaxis  . Penicillins Rash    Family History  Problem Relation Age of Onset  . Hypertension Mother   . Hyperlipidemia Father   . Hypertension Father   . Diabetes Maternal Aunt   . Heart disease Maternal Aunt   . Hyperlipidemia Maternal Aunt   . Heart disease Maternal Uncle   . Hyperlipidemia Maternal Uncle   . Hypertension Paternal Uncle   . Arthritis Maternal Grandmother   . Stroke Maternal Grandmother   . Hypertension Maternal Grandmother   . Hyperlipidemia Maternal Grandfather   . Heart disease Maternal Grandfather   . Cancer Paternal Grandmother     Breast  . Diabetes Paternal Grandmother   . Cancer Paternal Grandfather     Prostate,Colon  . Stroke Paternal Grandfather     Social History   Social History  . Marital Status: Married    Spouse Name: N/A  . Number of Children: N/A  . Years of Education: N/A   Social History Main Topics  . Smoking status: Current Some Day Smoker  . Smokeless tobacco: Never Used  . Alcohol Use: Yes     Comment: occasional  . Drug Use: No  . Sexual Activity: Yes    Birth Control/ Protection: None   Other Topics Concern  . None   Social History Narrative    Review of Systems - See HPI.  All  other ROS are negative.  BP 115/68 mmHg  Pulse 93  Temp(Src) 98.1 F (36.7 C) (Oral)  Ht 4' 11.75" (1.518 m)  Wt 147 lb 12.8 oz (67.042 kg)  BMI 29.09 kg/m2  SpO2 100%  LMP 06/04/2015  Physical Exam  Constitutional: She is oriented to person, place, and time and well-developed, well-nourished, and in no distress.  HENT:  Head: Normocephalic and atraumatic.  Eyes: Conjunctivae are normal.  Cardiovascular: Normal rate, regular rhythm, normal heart sounds and intact distal pulses.   Pulmonary/Chest: Effort normal and breath sounds normal. No respiratory distress. She has no wheezes. She has no rales. She exhibits no tenderness.  Neurological: She is alert and oriented to person, place, and time.  Skin: Skin is warm and dry. No rash noted.  Psychiatric: Affect normal.  Vitals reviewed.   Recent Results (from the past 2160 hour(s))  Comprehensive metabolic panel     Status: Abnormal   Collection Time: 06/10/15  2:40 PM  Result Value Ref Range   Sodium 139 135 - 145 mmol/L   Potassium 3.7 3.5 - 5.1 mmol/L   Chloride 104 101 - 111 mmol/L   CO2 27 22 - 32 mmol/L   Glucose, Bld 107 (H) 65 - 99 mg/dL   BUN 10 6 - 20 mg/dL   Creatinine, Ser 0.44 0.44 - 1.00 mg/dL     Calcium 9.1 8.9 - 10.3 mg/dL   Total Protein 8.0 6.5 - 8.1 g/dL   Albumin 4.3 3.5 - 5.0 g/dL   AST 17 15 - 41 U/L   ALT 16 14 - 54 U/L   Alkaline Phosphatase 56 38 - 126 U/L   Total Bilirubin 0.5 0.3 - 1.2 mg/dL   GFR calc non Af Amer >60 >60 mL/min   GFR calc Af Amer >60 >60 mL/min    Comment: (NOTE) The eGFR has been calculated using the CKD EPI equation. This calculation has not been validated in all clinical situations. eGFR's persistently <60 mL/min signify possible Chronic Kidney Disease.    Anion gap 8 5 - 15  CBC with Differential/Platelet     Status: Abnormal   Collection Time: 06/10/15  2:40 PM  Result Value Ref Range   WBC 9.7 4.0 - 10.5 K/uL   RBC 4.41 3.87 - 5.11 MIL/uL   Hemoglobin 12.6 12.0 -  15.0 g/dL   HCT 38.8 36.0 - 46.0 %   MCV 88.0 78.0 - 100.0 fL   MCH 28.6 26.0 - 34.0 pg   MCHC 32.5 30.0 - 36.0 g/dL   RDW 13.0 11.5 - 15.5 %   Platelets 415 (H) 150 - 400 K/uL   Neutrophils Relative % 61 %   Neutro Abs 5.9 1.7 - 7.7 K/uL   Lymphocytes Relative 32 %   Lymphs Abs 3.1 0.7 - 4.0 K/uL   Monocytes Relative 6 %   Monocytes Absolute 0.6 0.1 - 1.0 K/uL   Eosinophils Relative 1 %   Eosinophils Absolute 0.1 0.0 - 0.7 K/uL   Basophils Relative 0 %   Basophils Absolute 0.0 0.0 - 0.1 K/uL  Lipase, blood     Status: None   Collection Time: 06/10/15  2:40 PM  Result Value Ref Range   Lipase 18 11 - 51 U/L  Urinalysis, Routine w reflex microscopic (not at [facility])     Status: Abnormal   Collection Time: 06/10/15  2:40 PM  Result Value Ref Range   Color, Urine YELLOW YELLOW   APPearance TURBID (A) CLEAR   Specific Gravity, Urine 1.022 1.005 - 1.030   pH 8.0 5.0 - 8.0   Glucose, UA NEGATIVE NEGATIVE mg/dL   Hgb urine dipstick MODERATE (A) NEGATIVE   Bilirubin Urine NEGATIVE NEGATIVE   Ketones, ur NEGATIVE NEGATIVE mg/dL   Protein, ur NEGATIVE NEGATIVE mg/dL   Nitrite NEGATIVE NEGATIVE   Leukocytes, UA NEGATIVE NEGATIVE  Pregnancy, urine     Status: None   Collection Time: 06/10/15  2:40 PM  Result Value Ref Range   Preg Test, Ur NEGATIVE NEGATIVE    Comment:        THE SENSITIVITY OF THIS METHODOLOGY IS >20 mIU/mL.   Urine microscopic-add on     Status: Abnormal   Collection Time: 06/10/15  2:40 PM  Result Value Ref Range   Squamous Epithelial / LPF 0-5 (A) NONE SEEN   WBC, UA NONE SEEN 0 - 5 WBC/hpf   RBC / HPF 0-5 0 - 5 RBC/hpf   Bacteria, UA FEW (A) NONE SEEN   Urine-Other AMORPHOUS URATES/PHOSPHATES   CBC w/Diff     Status: Abnormal   Collection Time: 06/14/15  2:26 PM  Result Value Ref Range   WBC 10.6 (H) 4.0 - 10.5 K/uL   RBC 4.96 3.87 - 5.11 Mil/uL   Hemoglobin 14.6 12.0 - 15.0 g/dL   HCT 43.0 36.0 - 46.0 %   MCV 86.7 78.0 -   100.0 fl   MCHC 33.9 30.0 -  36.0 g/dL   RDW 14.3 11.5 - 15.5 %   Platelets 443.0 (H) 150.0 - 400.0 K/uL   Neutrophils Relative % 62.0 43.0 - 77.0 %   Lymphocytes Relative 30.1 12.0 - 46.0 %   Monocytes Relative 7.0 3.0 - 12.0 %   Eosinophils Relative 0.6 0.0 - 5.0 %   Basophils Relative 0.3 0.0 - 3.0 %   Neutro Abs 6.6 1.4 - 7.7 K/uL   Lymphs Abs 3.2 0.7 - 4.0 K/uL   Monocytes Absolute 0.8 0.1 - 1.0 K/uL   Eosinophils Absolute 0.1 0.0 - 0.7 K/uL   Basophils Absolute 0.0 0.0 - 0.1 K/uL  Sed Rate (ESR)     Status: None   Collection Time: 06/14/15  2:26 PM  Result Value Ref Range   Sed Rate 21 0 - 22 mm/hr  HIV antibody     Status: None   Collection Time: 06/14/15  2:26 PM  Result Value Ref Range   HIV 1&2 Ab, 4th Generation NONREACTIVE NONREACTIVE    Comment:   HIV-1 antigen and HIV-1/HIV-2 antibodies were not detected.  There is no laboratory evidence of HIV infection.   HIV-1/2 Antibody Diff        Not indicated. HIV-1 RNA, Qual TMA          Not indicated.     PLEASE NOTE: This information has been disclosed to you from records whose confidentiality may be protected by state law. If your state requires such protection, then the state law prohibits you from making any further disclosure of the information without the specific written consent of the person to whom it pertains, or as otherwise permitted by law. A general authorization for the release of medical or other information is NOT sufficient for this purpose.   The performance of this assay has not been clinically validated in patients less than 2 years old.   For additional information please refer to http://education.[employer].com/faq/FAQ106.  (This link is being provided for informational/educational purposes only.)     Comp Met (CMET)     Status: Abnormal   Collection Time: 06/14/15  2:26 PM  Result Value Ref Range   Sodium 136 135 - 145 mEq/L   Potassium 4.2 3.5 - 5.1 mEq/L   Chloride 98 96 - 112 mEq/L   CO2 27 19 - 32 mEq/L     Glucose, Bld 95 70 - 99 mg/dL   BUN 11 6 - 23 mg/dL   Creatinine, Ser 0.60 0.40 - 1.20 mg/dL   Total Bilirubin 0.8 0.2 - 1.2 mg/dL   Alkaline Phosphatase 60 39 - 117 U/L   AST 17 0 - 37 U/L   ALT 14 0 - 35 U/L   Total Protein 8.7 (H) 6.0 - 8.3 g/dL   Albumin 4.8 3.5 - 5.2 g/dL   Calcium 9.8 8.4 - 10.5 mg/dL   GFR 123.83 >60.00 mL/min  TSH     Status: None   Collection Time: 06/14/15  2:26 PM  Result Value Ref Range   TSH 1.80 0.35 - 4.50 uIU/mL  T4, free     Status: None   Collection Time: 06/14/15  2:26 PM  Result Value Ref Range   Free T4 1.02 0.60 - 1.60 ng/dL  CK (Creatine Kinase)     Status: None   Collection Time: 06/14/15  2:26 PM  Result Value Ref Range   Total CK 30 7 - 177 U/L    Assessment/Plan: Pruritus Improving.   Will increase Atarax to 25 mg TID. Continue supportive measures. Will refer to Allergy if not completely resolving.  Myalgia and myositis Improving. Unclear etiology. Patient is agreeable to Referral. Urgent referral to rheumatology placed.

## 2015-06-20 NOTE — Assessment & Plan Note (Signed)
Improving. Unclear etiology. Patient is agreeable to Referral. Urgent referral to rheumatology placed.

## 2015-06-20 NOTE — Progress Notes (Signed)
Pre visit review using our clinic review tool, if applicable. No additional management support is needed unless otherwise documented below in the visit note. 

## 2015-06-20 NOTE — Patient Instructions (Signed)
Please take the new dose of the Atarax. Continue pain medication as directed when needed for moderate pain. Try to wean down to extra strength tylenol.  You will be contacted for an appointment by Rheumatology.

## 2015-06-20 NOTE — Assessment & Plan Note (Signed)
Improving. Will increase Atarax to 25 mg TID. Continue supportive measures. Will refer to Allergy if not completely resolving.

## 2015-08-25 ENCOUNTER — Other Ambulatory Visit (HOSPITAL_BASED_OUTPATIENT_CLINIC_OR_DEPARTMENT_OTHER): Payer: Self-pay

## 2015-08-25 DIAGNOSIS — Z8619 Personal history of other infectious and parasitic diseases: Secondary | ICD-10-CM

## 2015-08-26 NOTE — Telephone Encounter (Signed)
From: Jodelle Grossourtney Jo Cummings  To: [name], [name], NP  Sent: 08/25/2015 2:53 PM EDT  Subject: Medication Renewal Request    Original authorizing provider: Carman ChingPamela Rise, NP    Anne Janourtney J. Bal would like a refill of the following medications:  valACYclovir (VALTREX) 500 mg Oral Tablet Carman Ching[[name] Jara, NP]    Preferred pharmacy: [facility] - [place], [place] - [address]    Comment:

## 2015-09-22 ENCOUNTER — Encounter: Payer: Self-pay | Admitting: Physician Assistant

## 2015-09-22 NOTE — Telephone Encounter (Signed)
If never filled by provider here then ask that she be seen so we can get history documented and diagnosis.

## 2015-09-29 NOTE — Telephone Encounter (Signed)
Called patient with information. Advised to call to scheduled appointment regarding this. Left this on patients answering machine. Patient not available.

## 2015-10-05 ENCOUNTER — Encounter: Payer: Self-pay | Admitting: Physician Assistant

## 2015-10-05 ENCOUNTER — Telehealth: Payer: Self-pay | Admitting: Physician Assistant

## 2015-10-05 DIAGNOSIS — Z0289 Encounter for other administrative examinations: Secondary | ICD-10-CM

## 2015-10-06 NOTE — Telephone Encounter (Signed)
NO charge.  

## 2015-10-06 NOTE — Telephone Encounter (Signed)
Pt was no show 10/05/15 for mychart physical, pt has not rescheduled, 1st no show, charge or no charge?

## 2015-10-07 ENCOUNTER — Encounter: Payer: Self-pay | Admitting: Physician Assistant

## 2015-10-07 NOTE — Telephone Encounter (Signed)
Waiving fee, mailing reminder letter °

## 2015-11-11 ENCOUNTER — Ambulatory Visit: Payer: Self-pay | Admitting: Physician Assistant

## 2015-11-11 DIAGNOSIS — Z0289 Encounter for other administrative examinations: Secondary | ICD-10-CM

## 2015-11-15 ENCOUNTER — Encounter: Payer: Self-pay | Admitting: Physician Assistant

## 2015-11-23 ENCOUNTER — Ambulatory Visit (INDEPENDENT_AMBULATORY_CARE_PROVIDER_SITE_OTHER): Payer: BLUE CROSS/BLUE SHIELD | Admitting: Physician Assistant

## 2015-11-23 ENCOUNTER — Encounter: Payer: Self-pay | Admitting: Physician Assistant

## 2015-11-23 VITALS — BP 102/68 | HR 97 | Temp 98.5°F | Resp 16 | Ht 59.75 in | Wt 130.5 lb

## 2015-11-23 DIAGNOSIS — Z23 Encounter for immunization: Secondary | ICD-10-CM

## 2015-11-23 DIAGNOSIS — Z0000 Encounter for general adult medical examination without abnormal findings: Secondary | ICD-10-CM | POA: Diagnosis not present

## 2015-11-23 DIAGNOSIS — F430 Acute stress reaction: Secondary | ICD-10-CM

## 2015-11-23 MED ORDER — VALACYCLOVIR HCL 500 MG PO TABS: 500.0000 mg | ORAL_TABLET | Freq: Every day | ORAL | 1 refills | Status: DC

## 2015-11-23 MED ORDER — ALPRAZOLAM 0.25 MG PO TABS: 0.2500 mg | ORAL_TABLET | Freq: Two times a day (BID) | ORAL | 0 refills | Status: DC | PRN

## 2015-11-23 MED ORDER — ESCITALOPRAM OXALATE 10 MG PO TABS: 10.0000 mg | ORAL_TABLET | Freq: Every day | ORAL | 1 refills | Status: DC

## 2015-11-23 NOTE — Progress Notes (Signed)
Patient presents to clinic today for annual exam.  Patient is fasting for labs. Body mass index is 25.7 kg/m. Patient has been working on diet and exercise with a 15 pound weight loss.   Acute Concerns: Patient endorses increased stressors due to going through a divorce at the moment. Endorses anxiety and stress with some emotional lability. Denies SI/HI. Would like to discuss treatment options.  Health Maintenance: Immunizations -- Due for TDap. Agrees to vaccine today. PAP -- last in 2015. Normal per patient. Denies history of abnormal PAP. Will place on 3 year schedule with HPV co-testing.  Past Medical History:  Diagnosis Date  . Asthma   . Chicken pox     Past Surgical History:  Procedure Laterality Date  . BREAST SURGERY    . CESAREAN SECTION    . fallopian tube removal    . HERNIA REPAIR      Current Outpatient Prescriptions on File Prior to Visit  Medication Sig Dispense Refill  . HYDROcodone-acetaminophen (NORCO) 10-325 MG tablet Take 1 tablet by mouth every 8 (eight) hours as needed. 30 tablet 0  . Multiple Vitamin (MULTIVITAMIN) capsule Take 1 capsule by mouth daily.     No current facility-administered medications on file prior to visit.     Allergies  Allergen Reactions  . Nsaids Anaphylaxis  . Penicillins Rash    Family History  Problem Relation Age of Onset  . Hypertension Mother   . Hyperlipidemia Father   . Hypertension Father   . Diabetes Maternal Aunt   . Heart disease Maternal Aunt   . Hyperlipidemia Maternal Aunt   . Heart disease Maternal Uncle   . Hyperlipidemia Maternal Uncle   . Hypertension Paternal Uncle   . Arthritis Maternal Grandmother   . Stroke Maternal Grandmother   . Hypertension Maternal Grandmother   . Hyperlipidemia Maternal Grandfather   . Heart disease Maternal Grandfather   . Cancer Paternal Grandmother     Breast  . Diabetes Paternal Grandmother   . Cancer Paternal Geophysicist/field seismologist  . Stroke  Paternal Grandfather     Social History   Social History  . Marital status: Married    Spouse name: N/A  . Number of children: N/A  . Years of education: N/A   Occupational History  . Not on file.   Social History Main Topics  . Smoking status: Current Some Day Smoker  . Smokeless tobacco: Never Used  . Alcohol use Yes     Comment: occasional  . Drug use: No  . Sexual activity: Yes    Birth control/ protection: None   Other Topics Concern  . Not on file   Social History Narrative  . No narrative on file    Review of Systems  Constitutional: Negative for fever and weight loss.  HENT: Negative for ear discharge, ear pain, hearing loss and tinnitus.   Eyes: Negative for blurred vision, double vision, photophobia and pain.  Respiratory: Negative for cough and shortness of breath.   Cardiovascular: Negative for chest pain and palpitations.  Gastrointestinal: Negative for abdominal pain, blood in stool, constipation, diarrhea, heartburn, melena, nausea and vomiting.  Genitourinary: Negative for dysuria, flank pain, frequency, hematuria and urgency.  Musculoskeletal: Negative for falls.  Neurological: Negative for dizziness, loss of consciousness and headaches.  Endo/Heme/Allergies: Negative for environmental allergies.  Psychiatric/Behavioral: Positive for depression. Negative for hallucinations, substance abuse and suicidal ideas. The patient is nervous/anxious. The patient does not have insomnia.  BP 102/68 (BP Location: Left Arm, Patient Position: Sitting, Cuff Size: Normal)   Pulse 97   Temp 98.5 F (36.9 C) (Oral)   Resp 16   Ht 4' 11.75" (1.518 m)   Wt 130 lb 8 oz (59.2 kg)   LMP 10/26/2015   SpO2 99%   BMI 25.70 kg/m   Physical Exam  Constitutional: She is oriented to person, place, and time and well-developed, well-nourished, and in no distress.  HENT:  Head: Normocephalic and atraumatic.  Right Ear: Tympanic membrane, external ear and ear canal normal.    Left Ear: Tympanic membrane, external ear and ear canal normal.  Nose: Nose normal. No mucosal edema.  Mouth/Throat: Uvula is midline, oropharynx is clear and moist and mucous membranes are normal. No oropharyngeal exudate or posterior oropharyngeal erythema.  Eyes: Conjunctivae are normal. Pupils are equal, round, and reactive to light.  Neck: Neck supple. No thyromegaly present.  Cardiovascular: Normal rate, regular rhythm, normal heart sounds and intact distal pulses.   Pulmonary/Chest: Effort normal and breath sounds normal. No respiratory distress. She has no wheezes. She has no rales.  Abdominal: Soft. Bowel sounds are normal. She exhibits no distension and no mass. There is no tenderness. There is no rebound and no guarding.  Lymphadenopathy:    She has no cervical adenopathy.  Neurological: She is alert and oriented to person, place, and time. No cranial nerve deficit.  Skin: Skin is warm and dry. No rash noted.  Psychiatric: Affect normal.  Vitals reviewed.  Assessment/Plan: Visit for preventive health examination Depression screen negative. Health Maintenance reviewed -- TDaP updated. PAP up-to-date. Will repeat in 2018 with HPV co-testing. Preventive schedule discussed and handout given in AVS. Will obtain fasting labs today.    Acute stress reaction No alarm signs/symptoms. Discussed treatment options. Handout given on counseling. She will schedule appt. Will Rx 10 tablet of low-dose xanax to have on hand in case of panic attack. Will begin Lexapro 10 mg daily. Precautions discussed with patient that would warrant ER assessment. FU 1 month.    Piedad Climes, PA-C

## 2015-11-23 NOTE — Assessment & Plan Note (Signed)
Depression screen negative. Health Maintenance reviewed -- TDaP updated. PAP up-to-date. Will repeat in 2018 with HPV co-testing. Preventive schedule discussed and handout given in AVS. Will obtain fasting labs today.

## 2015-11-23 NOTE — Progress Notes (Signed)
Pre visit review using our clinic review tool, if applicable. No additional management support is needed unless otherwise documented below in the visit note/SLS  

## 2015-11-23 NOTE — Assessment & Plan Note (Signed)
No alarm signs/symptoms. Discussed treatment options. Handout given on counseling. She will schedule appt. Will Rx 10 tablet of low-dose xanax to have on hand in case of panic attack. Will begin Lexapro 10 mg daily. Precautions discussed with patient that would warrant ER assessment. FU 1 month.

## 2015-11-23 NOTE — Patient Instructions (Signed)
Please go to the lab for blood work.   Our office will call you with your results unless you have chosen to receive results via MyChart.  If your blood work is normal we will follow-up each year for physicals and as scheduled for chronic medical problems. If anything is abnormal we will treat accordingly and get you in for a follow-up.  Please continue your chronic medications as directed.  Start the Lexapro, taking 1/2 tablet dail x 2-3 days. Then increase to 1 tablet daily. Use the xanax as directed if needed for panic attack.  Look at the handout I have given you to schedule an appointment with one of our counselors.  Follow-up with me in 1 month.  If you note any worsening symptoms on medication, stop the medicine and call me. If you develop any thoughts of harming yourself or others, please see me immediately or go to the ER.  Preventive Care for Adults, Female A healthy lifestyle and preventive care can promote health and wellness. Preventive health guidelines for women include the following key practices.  A routine yearly physical is a good way to check with your health care provider about your health and preventive screening. It is a chance to share any concerns and updates on your health and to receive a thorough exam.  Visit your dentist for a routine exam and preventive care every 6 months. Brush your teeth twice a day and floss once a day. Good oral hygiene prevents tooth decay and gum disease.  The frequency of eye exams is based on your age, health, family medical history, use of contact lenses, and other factors. Follow your health care provider's recommendations for frequency of eye exams.  Eat a healthy diet. Foods like vegetables, fruits, whole grains, low-fat dairy products, and lean protein foods contain the nutrients you need without too many calories. Decrease your intake of foods high in solid fats, added sugars, and salt. Eat the right amount of calories for  you.Get information about a proper diet from your health care provider, if necessary.  Regular physical exercise is one of the most important things you can do for your health. Most adults should get at least 150 minutes of moderate-intensity exercise (any activity that increases your heart rate and causes you to sweat) each week. In addition, most adults need muscle-strengthening exercises on 2 or more days a week.  Maintain a healthy weight. The body mass index (BMI) is a screening tool to identify possible weight problems. It provides an estimate of body fat based on height and weight. Your health care provider can find your BMI and can help you achieve or maintain a healthy weight.For adults 20 years and older:  A BMI below 18.5 is considered underweight.  A BMI of 18.5 to 24.9 is normal.  A BMI of 25 to 29.9 is considered overweight.  A BMI of 30 and above is considered obese.  Maintain normal blood lipids and cholesterol levels by exercising and minimizing your intake of saturated fat. Eat a balanced diet with plenty of fruit and vegetables. Blood tests for lipids and cholesterol should begin at age 44 and be repeated every 5 years. If your lipid or cholesterol levels are high, you are over 50, or you are at high risk for heart disease, you may need your cholesterol levels checked more frequently.Ongoing high lipid and cholesterol levels should be treated with medicines if diet and exercise are not working.  If you smoke, find out from your health  care provider how to quit. If you do not use tobacco, do not start.  Lung cancer screening is recommended for adults aged 36-80 years who are at high risk for developing lung cancer because of a history of smoking. A yearly low-dose CT scan of the lungs is recommended for people who have at least a 30-pack-year history of smoking and are a current smoker or have quit within the past 15 years. A pack year of smoking is smoking an average of 1 pack  of cigarettes a day for 1 year (for example: 1 pack a day for 30 years or 2 packs a day for 15 years). Yearly screening should continue until the smoker has stopped smoking for at least 15 years. Yearly screening should be stopped for people who develop a health problem that would prevent them from having lung cancer treatment.  If you are pregnant, do not drink alcohol. If you are breastfeeding, be very cautious about drinking alcohol. If you are not pregnant and choose to drink alcohol, do not have more than 1 drink per day. One drink is considered to be 12 ounces (355 mL) of beer, 5 ounces (148 mL) of wine, or 1.5 ounces (44 mL) of liquor.  Avoid use of street drugs. Do not share needles with anyone. Ask for help if you need support or instructions about stopping the use of drugs.  High blood pressure causes heart disease and increases the risk of stroke. Your blood pressure should be checked at least every 1 to 2 years. Ongoing high blood pressure should be treated with medicines if weight loss and exercise do not work.  If you are 55-58 years old, ask your health care provider if you should take aspirin to prevent strokes.  Diabetes screening is done by taking a blood sample to check your blood glucose level after you have not eaten for a certain period of time (fasting). If you are not overweight and you do not have risk factors for diabetes, you should be screened once every 3 years starting at age 58. If you are overweight or obese and you are 30-69 years of age, you should be screened for diabetes every year as part of your cardiovascular risk assessment.  Breast cancer screening is essential preventive care for women. You should practice "breast self-awareness." This means understanding the normal appearance and feel of your breasts and may include breast self-examination. Any changes detected, no matter how small, should be reported to a health care provider. Women in their 42s and 30s should  have a clinical breast exam (CBE) by a health care provider as part of a regular health exam every 1 to 3 years. After age 68, women should have a CBE every year. Starting at age 70, women should consider having a mammogram (breast X-ray test) every year. Women who have a family history of breast cancer should talk to their health care provider about genetic screening. Women at a high risk of breast cancer should talk to their health care providers about having an MRI and a mammogram every year.  Breast cancer gene (BRCA)-related cancer risk assessment is recommended for women who have family members with BRCA-related cancers. BRCA-related cancers include breast, ovarian, tubal, and peritoneal cancers. Having family members with these cancers may be associated with an increased risk for harmful changes (mutations) in the breast cancer genes BRCA1 and BRCA2. Results of the assessment will determine the need for genetic counseling and BRCA1 and BRCA2 testing.  Your health care  provider may recommend that you be screened regularly for cancer of the pelvic organs (ovaries, uterus, and vagina). This screening involves a pelvic examination, including checking for microscopic changes to the surface of your cervix (Pap test). You may be encouraged to have this screening done every 3 years, beginning at age 76.  For women ages 81-65, health care providers may recommend pelvic exams and Pap testing every 3 years, or they may recommend the Pap and pelvic exam, combined with testing for human papilloma virus (HPV), every 5 years. Some types of HPV increase your risk of cervical cancer. Testing for HPV may also be done on women of any age with unclear Pap test results.  Other health care providers may not recommend any screening for nonpregnant women who are considered low risk for pelvic cancer and who do not have symptoms. Ask your health care provider if a screening pelvic exam is right for you.  If you have had  past treatment for cervical cancer or a condition that could lead to cancer, you need Pap tests and screening for cancer for at least 20 years after your treatment. If Pap tests have been discontinued, your risk factors (such as having a new sexual partner) need to be reassessed to determine if screening should resume. Some women have medical problems that increase the chance of getting cervical cancer. In these cases, your health care provider may recommend more frequent screening and Pap tests.  Colorectal cancer can be detected and often prevented. Most routine colorectal cancer screening begins at the age of 61 years and continues through age 62 years. However, your health care provider may recommend screening at an earlier age if you have risk factors for colon cancer. On a yearly basis, your health care provider may provide home test kits to check for hidden blood in the stool. Use of a small camera at the end of a tube, to directly examine the colon (sigmoidoscopy or colonoscopy), can detect the earliest forms of colorectal cancer. Talk to your health care provider about this at age 82, when routine screening begins. Direct exam of the colon should be repeated every 5-10 years through age 30 years, unless early forms of precancerous polyps or small growths are found.  People who are at an increased risk for hepatitis B should be screened for this virus. You are considered at high risk for hepatitis B if:  You were born in a country where hepatitis B occurs often. Talk with your health care provider about which countries are considered high risk.  Your parents were born in a high-risk country and you have not received a shot to protect against hepatitis B (hepatitis B vaccine).  You have HIV or AIDS.  You use needles to inject street drugs.  You live with, or have sex with, someone who has hepatitis B.  You get hemodialysis treatment.  You take certain medicines for conditions like cancer,  organ transplantation, and autoimmune conditions.  Hepatitis C blood testing is recommended for all people born from [birth year] through 1967 and any individual with known risks for hepatitis C.  Practice safe sex. Use condoms and avoid high-risk sexual practices to reduce the spread of sexually transmitted infections (STIs). STIs include gonorrhea, chlamydia, syphilis, trichomonas, herpes, HPV, and human immunodeficiency virus (HIV). Herpes, HIV, and HPV are viral illnesses that have no cure. They can result in disability, cancer, and death.  You should be screened for sexually transmitted illnesses (STIs) including gonorrhea and chlamydia if:  You  are sexually active and are younger than 24 years.  You are older than 24 years and your health care provider tells you that you are at risk for this type of infection.  Your sexual activity has changed since you were last screened and you are at an increased risk for chlamydia or gonorrhea. Ask your health care provider if you are at risk.  If you are at risk of being infected with HIV, it is recommended that you take a prescription medicine daily to prevent HIV infection. This is called preexposure prophylaxis (PrEP). You are considered at risk if:  You are sexually active and do not regularly use condoms or know the HIV status of your partner(s).  You take drugs by injection.  You are sexually active with a partner who has HIV.  Talk with your health care provider about whether you are at high risk of being infected with HIV. If you choose to begin PrEP, you should first be tested for HIV. You should then be tested every 3 months for as long as you are taking PrEP.  Osteoporosis is a disease in which the bones lose minerals and strength with aging. This can result in serious bone fractures or breaks. The risk of osteoporosis can be identified using a bone density scan. Women ages 39 years and over and women at risk for fractures or osteoporosis should  discuss screening with their health care providers. Ask your health care provider whether you should take a calcium supplement or vitamin D to reduce the rate of osteoporosis.  Menopause can be associated with physical symptoms and risks. Hormone replacement therapy is available to decrease symptoms and risks. You should talk to your health care provider about whether hormone replacement therapy is right for you.  Use sunscreen. Apply sunscreen liberally and repeatedly throughout the day. You should seek shade when your shadow is shorter than you. Protect yourself by wearing long sleeves, pants, a wide-brimmed hat, and sunglasses year round, whenever you are outdoors.  Once a month, do a whole body skin exam, using a mirror to look at the skin on your back. Tell your health care provider of new moles, moles that have irregular borders, moles that are larger than a pencil eraser, or moles that have changed in shape or color.  Stay current with required vaccines (immunizations).  Influenza vaccine. All adults should be immunized every year.  Tetanus, diphtheria, and acellular pertussis (Td, Tdap) vaccine. Pregnant women should receive 1 dose of Tdap vaccine during each pregnancy. The dose should be obtained regardless of the length of time since the last dose. Immunization is preferred during the 27th-36th week of gestation. An adult who has not previously received Tdap or who does not know her vaccine status should receive 1 dose of Tdap. This initial dose should be followed by tetanus and diphtheria toxoids (Td) booster doses every 10 years. Adults with an unknown or incomplete history of completing a 3-dose immunization series with Td-containing vaccines should begin or complete a primary immunization series including a Tdap dose. Adults should receive a Td booster every 10 years.  Varicella vaccine. An adult without evidence of immunity to varicella should receive 2 doses or a second dose if she has  previously received 1 dose. Pregnant females who do not have evidence of immunity should receive the first dose after pregnancy. This first dose should be obtained before leaving the health care facility. The second dose should be obtained 4-8 weeks after the first dose.  Human papillomavirus (HPV) vaccine. Females aged 13-26 years who have not received the vaccine previously should obtain the 3-dose series. The vaccine is not recommended for use in pregnant females. However, pregnancy testing is not needed before receiving a dose. If a female is found to be pregnant after receiving a dose, no treatment is needed. In that case, the remaining doses should be delayed until after the pregnancy. Immunization is recommended for any person with an immunocompromised condition through the age of 94 years if she did not get any or all doses earlier. During the 3-dose series, the second dose should be obtained 4-8 weeks after the first dose. The third dose should be obtained 24 weeks after the first dose and 16 weeks after the second dose.  Zoster vaccine. One dose is recommended for adults aged 25 years or older unless certain conditions are present.  Measles, mumps, and rubella (MMR) vaccine. Adults born before [birth year] generally are considered immune to measles and mumps. Adults born in [birth year] or later should have 1 or more doses of MMR vaccine unless there is a contraindication to the vaccine or there is laboratory evidence of immunity to each of the three diseases. A routine second dose of MMR vaccine should be obtained at least 28 days after the first dose for students attending postsecondary schools, health care workers, or international travelers. People who received inactivated measles vaccine or an unknown type of measles vaccine during 1963-1967 should receive 2 doses of MMR vaccine. People who received inactivated mumps vaccine or an unknown type of mumps vaccine before 1979 and are at high risk for mumps  infection should consider immunization with 2 doses of MMR vaccine. For females of childbearing age, rubella immunity should be determined. If there is no evidence of immunity, females who are not pregnant should be vaccinated. If there is no evidence of immunity, females who are pregnant should delay immunization until after pregnancy. Unvaccinated health care workers born before [birth year] who lack laboratory evidence of measles, mumps, or rubella immunity or laboratory confirmation of disease should consider measles and mumps immunization with 2 doses of MMR vaccine or rubella immunization with 1 dose of MMR vaccine.  Pneumococcal 13-valent conjugate (PCV13) vaccine. When indicated, a person who is uncertain of his immunization history and has no record of immunization should receive the PCV13 vaccine. All adults 76 years of age and older should receive this vaccine. An adult aged 49 years or older who has certain medical conditions and has not been previously immunized should receive 1 dose of PCV13 vaccine. This PCV13 should be followed with a dose of pneumococcal polysaccharide (PPSV23) vaccine. Adults who are at high risk for pneumococcal disease should obtain the PPSV23 vaccine at least 8 weeks after the dose of PCV13 vaccine. Adults older than 32 years of age who have normal immune system function should obtain the PPSV23 vaccine dose at least 1 year after the dose of PCV13 vaccine.  Pneumococcal polysaccharide (PPSV23) vaccine. When PCV13 is also indicated, PCV13 should be obtained first. All adults aged 69 years and older should be immunized. An adult younger than age 10 years who has certain medical conditions should be immunized. Any person who resides in a nursing home or long-term care facility should be immunized. An adult smoker should be immunized. People with an immunocompromised condition and certain other conditions should receive both PCV13 and PPSV23 vaccines. People with human immunodeficiency  virus (HIV) infection should be immunized as soon as possible after diagnosis. Immunization during  chemotherapy or radiation therapy should be avoided. Routine use of PPSV23 vaccine is not recommended for American Indians, [place] Natives, or people younger than 65 years unless there are medical conditions that require PPSV23 vaccine. When indicated, people who have unknown immunization and have no record of immunization should receive PPSV23 vaccine. One-time revaccination 5 years after the first dose of PPSV23 is recommended for people aged 19-64 years who have chronic kidney failure, nephrotic syndrome, asplenia, or immunocompromised conditions. People who received 1-2 doses of PPSV23 before age 52 years should receive another dose of PPSV23 vaccine at age 19 years or later if at least 5 years have passed since the previous dose. Doses of PPSV23 are not needed for people immunized with PPSV23 at or after age 24 years.  Meningococcal vaccine. Adults with asplenia or persistent complement component deficiencies should receive 2 doses of quadrivalent meningococcal conjugate (MenACWY-D) vaccine. The doses should be obtained at least 2 months apart. Microbiologists working with certain meningococcal bacteria, [employer] recruits, people at risk during an outbreak, and people who travel to or live in countries with a high rate of meningitis should be immunized. A first-year college student up through age 53 years who is living in a residence hall should receive a dose if she did not receive a dose on or after her 16th birthday. Adults who have certain high-risk conditions should receive one or more doses of vaccine.  Hepatitis A vaccine. Adults who wish to be protected from this disease, have certain high-risk conditions, work with hepatitis A-infected animals, work in hepatitis A research labs, or travel to or work in countries with a high rate of hepatitis A should be immunized. Adults who were previously  unvaccinated and who anticipate close contact with an international adoptee during the first 60 days after arrival in the [place] from a country with a high rate of hepatitis A should be immunized.  Hepatitis B vaccine. Adults who wish to be protected from this disease, have certain high-risk conditions, may be exposed to blood or other infectious body fluids, are household contacts or sex partners of hepatitis B positive people, are clients or workers in certain care facilities, or travel to or work in countries with a high rate of hepatitis B should be immunized.  Haemophilus influenzae type b (Hib) vaccine. A previously unvaccinated person with asplenia or sickle cell disease or having a scheduled splenectomy should receive 1 dose of Hib vaccine. Regardless of previous immunization, a recipient of a hematopoietic stem cell transplant should receive a 3-dose series 6-12 months after her successful transplant. Hib vaccine is not recommended for adults with HIV infection. Preventive Services / Frequency Ages 30 to 45 years  Blood pressure check.** / Every 3-5 years.  Lipid and cholesterol check.** / Every 5 years beginning at age 54.  Clinical breast exam.** / Every 3 years for women in their 67s and 3s.  BRCA-related cancer risk assessment.** / For women who have family members with a BRCA-related cancer (breast, ovarian, tubal, or peritoneal cancers).  Pap test.** / Every 2 years from ages 51 through 33. Every 3 years starting at age 95 through age 37 or 51 with a history of 3 consecutive normal Pap tests.  HPV screening.** / Every 3 years from ages 36 through ages 93 to 69 with a history of 3 consecutive normal Pap tests.  Hepatitis C blood test.** / For any individual with known risks for hepatitis C.  Skin self-exam. / Monthly.  Influenza vaccine. / Every  year.  Tetanus, diphtheria, and acellular pertussis (Tdap, Td) vaccine.** / Consult your health care provider. Pregnant women  should receive 1 dose of Tdap vaccine during each pregnancy. 1 dose of Td every 10 years.  Varicella vaccine.** / Consult your health care provider. Pregnant females who do not have evidence of immunity should receive the first dose after pregnancy.  HPV vaccine. / 3 doses over 6 months, if 93 and younger. The vaccine is not recommended for use in pregnant females. However, pregnancy testing is not needed before receiving a dose.  Measles, mumps, rubella (MMR) vaccine.** / You need at least 1 dose of MMR if you were born in 1957 or later. You may also need a 2nd dose. For females of childbearing age, rubella immunity should be determined. If there is no evidence of immunity, females who are not pregnant should be vaccinated. If there is no evidence of immunity, females who are pregnant should delay immunization until after pregnancy.  Pneumococcal 13-valent conjugate (PCV13) vaccine.** / Consult your health care provider.  Pneumococcal polysaccharide (PPSV23) vaccine.** / 1 to 2 doses if you smoke cigarettes or if you have certain conditions.  Meningococcal vaccine.** / 1 dose if you are age 83 to 12 years and a Market researcher living in a residence hall, or have one of several medical conditions, you need to get vaccinated against meningococcal disease. You may also need additional booster doses.  Hepatitis A vaccine.** / Consult your health care provider.  Hepatitis B vaccine.** / Consult your health care provider.  Haemophilus influenzae type b (Hib) vaccine.** / Consult your health care provider. Ages 74 to 57 years  Blood pressure check.** / Every year.  Lipid and cholesterol check.** / Every 5 years beginning at age 34 years.  Lung cancer screening. / Every year if you are aged 17-80 years and have a 30-pack-year history of smoking and currently smoke or have quit within the past 15 years. Yearly screening is stopped once you have quit smoking for at least 15 years or  develop a health problem that would prevent you from having lung cancer treatment.  Clinical breast exam.** / Every year after age 74 years.  BRCA-related cancer risk assessment.** / For women who have family members with a BRCA-related cancer (breast, ovarian, tubal, or peritoneal cancers).  Mammogram.** / Every year beginning at age 71 years and continuing for as long as you are in good health. Consult with your health care provider.  Pap test.** / Every 3 years starting at age 20 years through age 102 or 65 years with a history of 3 consecutive normal Pap tests.  HPV screening.** / Every 3 years from ages 15 years through ages 87 to 58 years with a history of 3 consecutive normal Pap tests.  Fecal occult blood test (FOBT) of stool. / Every year beginning at age 69 years and continuing until age 53 years. You may not need to do this test if you get a colonoscopy every 10 years.  Flexible sigmoidoscopy or colonoscopy.** / Every 5 years for a flexible sigmoidoscopy or every 10 years for a colonoscopy beginning at age 37 years and continuing until age 42 years.  Hepatitis C blood test.** / For all people born from [birth year] through 1967 and any individual with known risks for hepatitis C.  Skin self-exam. / Monthly.  Influenza vaccine. / Every year.  Tetanus, diphtheria, and acellular pertussis (Tdap/Td) vaccine.** / Consult your health care provider. Pregnant women should receive 1 dose of  Tdap vaccine during each pregnancy. 1 dose of Td every 10 years.  Varicella vaccine.** / Consult your health care provider. Pregnant females who do not have evidence of immunity should receive the first dose after pregnancy.  Zoster vaccine.** / 1 dose for adults aged 44 years or older.  Measles, mumps, rubella (MMR) vaccine.** / You need at least 1 dose of MMR if you were born in 1957 or later. You may also need a second dose. For females of childbearing age, rubella immunity should be determined. If there  is no evidence of immunity, females who are not pregnant should be vaccinated. If there is no evidence of immunity, females who are pregnant should delay immunization until after pregnancy.  Pneumococcal 13-valent conjugate (PCV13) vaccine.** / Consult your health care provider.  Pneumococcal polysaccharide (PPSV23) vaccine.** / 1 to 2 doses if you smoke cigarettes or if you have certain conditions.  Meningococcal vaccine.** / Consult your health care provider.  Hepatitis A vaccine.** / Consult your health care provider.  Hepatitis B vaccine.** / Consult your health care provider.  Haemophilus influenzae type b (Hib) vaccine.** / Consult your health care provider. Ages 2 years and over  Blood pressure check.** / Every year.  Lipid and cholesterol check.** / Every 5 years beginning at age 88 years.  Lung cancer screening. / Every year if you are aged 64-80 years and have a 30-pack-year history of smoking and currently smoke or have quit within the past 15 years. Yearly screening is stopped once you have quit smoking for at least 15 years or develop a health problem that would prevent you from having lung cancer treatment.  Clinical breast exam.** / Every year after age 24 years.  BRCA-related cancer risk assessment.** / For women who have family members with a BRCA-related cancer (breast, ovarian, tubal, or peritoneal cancers).  Mammogram.** / Every year beginning at age 41 years and continuing for as long as you are in good health. Consult with your health care provider.  Pap test.** / Every 3 years starting at age 27 years through age 53 or 84 years with 3 consecutive normal Pap tests. Testing can be stopped between 65 and 70 years with 3 consecutive normal Pap tests and no abnormal Pap or HPV tests in the past 10 years.  HPV screening.** / Every 3 years from ages 14 years through ages 42 or 88 years with a history of 3 consecutive normal Pap tests. Testing can be stopped between 65  and 70 years with 3 consecutive normal Pap tests and no abnormal Pap or HPV tests in the past 10 years.  Fecal occult blood test (FOBT) of stool. / Every year beginning at age 73 years and continuing until age 63 years. You may not need to do this test if you get a colonoscopy every 10 years.  Flexible sigmoidoscopy or colonoscopy.** / Every 5 years for a flexible sigmoidoscopy or every 10 years for a colonoscopy beginning at age 41 years and continuing until age 69 years.  Hepatitis C blood test.** / For all people born from [birth year] through 1967 and any individual with known risks for hepatitis C.  Osteoporosis screening.** / A one-time screening for women ages 31 years and over and women at risk for fractures or osteoporosis.  Skin self-exam. / Monthly.  Influenza vaccine. / Every year.  Tetanus, diphtheria, and acellular pertussis (Tdap/Td) vaccine.** / 1 dose of Td every 10 years.  Varicella vaccine.** / Consult your health care provider.  Zoster vaccine.** /  1 dose for adults aged 31 years or older.  Pneumococcal 13-valent conjugate (PCV13) vaccine.** / Consult your health care provider.  Pneumococcal polysaccharide (PPSV23) vaccine.** / 1 dose for all adults aged 10 years and older.  Meningococcal vaccine.** / Consult your health care provider.  Hepatitis A vaccine.** / Consult your health care provider.  Hepatitis B vaccine.** / Consult your health care provider.  Haemophilus influenzae type b (Hib) vaccine.** / Consult your health care provider. ** Family history and personal history of risk and conditions may change your health care provider's recommendations.   This information is not intended to replace advice given to you by your health care provider. Make sure you discuss any questions you have with your health care provider.   Document Released: 06/12/2001 Document Revised: 05/07/2014 Document Reviewed: 09/11/2010 Elsevier Interactive Patient Education [employer].  .

## 2015-11-24 LAB — TSH: TSH: 1.11 u[IU]/mL (ref 0.35–4.50)

## 2015-11-24 LAB — COMPREHENSIVE METABOLIC PANEL
ALT: 8 U/L (ref 0–35)
AST: 11 U/L (ref 0–37)
Albumin: 4.2 g/dL (ref 3.5–5.2)
Alkaline Phosphatase: 43 U/L (ref 39–117)
BUN: 7 mg/dL (ref 6–23)
CO2: 27 mEq/L (ref 19–32)
Calcium: 9.2 mg/dL (ref 8.4–10.5)
Chloride: 105 mEq/L (ref 96–112)
Creatinine, Ser: 0.6 mg/dL (ref 0.40–1.20)
GFR: 123.47 mL/min (ref >60.00)
Glucose, Bld: 70 mg/dL (ref 70–99)
Potassium: 4.1 mEq/L (ref 3.5–5.1)
Sodium: 139 mEq/L (ref 135–145)
Total Bilirubin: 0.7 mg/dL (ref 0.2–1.2)
Total Protein: 7.4 g/dL (ref 6.0–8.3)

## 2015-11-24 LAB — URINALYSIS, ROUTINE W REFLEX MICROSCOPIC
Bilirubin Urine: NEGATIVE
Hgb urine dipstick: NEGATIVE
Ketones, ur: NEGATIVE
Leukocytes, UA: NEGATIVE
Nitrite: NEGATIVE
RBC / HPF: NONE SEEN (ref 0–?)
Specific Gravity, Urine: 1.015 (ref 1.000–1.030)
Total Protein, Urine: NEGATIVE
Urine Glucose: NEGATIVE
Urobilinogen, UA: 0.2 (ref 0.0–1.0)
pH: 8 (ref 5.0–8.0)

## 2015-11-24 LAB — LIPID PANEL
Cholesterol: 158 mg/dL (ref 0–200)
HDL: 48.6 mg/dL (ref >39.00)
LDL Cholesterol: 95 mg/dL (ref 0–99)
NonHDL: 109.77
Total CHOL/HDL Ratio: 3
Triglycerides: 75 mg/dL (ref 0.0–149.0)
VLDL: 15 mg/dL (ref 0.0–40.0)

## 2015-12-26 ENCOUNTER — Ambulatory Visit: Payer: BLUE CROSS/BLUE SHIELD | Admitting: Physician Assistant

## 2015-12-26 DIAGNOSIS — Z0289 Encounter for other administrative examinations: Secondary | ICD-10-CM

## 2015-12-27 ENCOUNTER — Encounter (INDEPENDENT_AMBULATORY_CARE_PROVIDER_SITE_OTHER): Payer: Self-pay | Admitting: Obstetrics & Gynecology

## 2015-12-31 ENCOUNTER — Inpatient Hospital Stay (HOSPITAL_COMMUNITY)
Admission: AD | Admit: 2015-12-31 | Discharge: 2015-12-31 | Disposition: A | Discharge status: Home / Self Care | Payer: BLUE CROSS/BLUE SHIELD | Source: Ambulatory Visit | Attending: Obstetrics and Gynecology | Admitting: Obstetrics and Gynecology

## 2015-12-31 DIAGNOSIS — Z3201 Encounter for pregnancy test, result positive: Secondary | ICD-10-CM | POA: Diagnosis present

## 2015-12-31 DIAGNOSIS — O0281 Inappropriate change in quantitative human chorionic gonadotropin (hCG) in early pregnancy: Secondary | ICD-10-CM

## 2015-12-31 LAB — HCG, QUANTITATIVE, PREGNANCY: hCG, Beta Chain, Quant, S: 6709 m[IU]/mL — ABNORMAL HIGH (ref <5)

## 2015-12-31 NOTE — MAU Note (Signed)
Patient had HCG drawn in office on Thursday and was told to have redrawn today, denies pain no vaginal bleeding, history of multiple miscarriages.

## 2015-12-31 NOTE — MAU Provider Note (Signed)
History   Chief Complaint:  No chief complaint on file.   Lisa Dyer is  30 y.o. No obstetric history on file. No LMP recorded.. Patient is here for follow up of quantitative HCG and ongoing surveillance of pregnancy status.   She is Unknown weeks gestation  by LMP.    Since her last visit, the patient is without new complaint.   The patient reports bleeding as  none now.  n/a  General ROS:  negative  Her previous Quantitative HCG values are:     Physical Exam   Blood pressure 119/70, pulse 96, temperature 98.1 F (36.7 C), resp. rate 16, height 5' (1.524 m), weight 134 lb 1.9 oz (60.8 kg).  Focused Gynecological Exam: normal external genitalia, vulva, vagina, cervix, uterus and adnexa, examination not indicated  Labs: Results for orders placed or performed during the hospital encounter of 12/31/15 (from the past 24 hour(s))  hCG, quantitative, pregnancy   Collection Time: 12/31/15  2:05 PM  Result Value Ref Range   hCG, Beta Chain, Quant, S 6,709 (H) <5 mIU/mL    Ultrasound Studies:   No results found.  Assessment:  Unknown weeks gestation  stable   Plan: The patient is instructed to follow up in in the next few weeks per Dr. Lavenia AtlasLegar.  Lisa Dyer 12/31/2015, 3:29 PM

## 2015-12-31 NOTE — MAU Note (Signed)
Called patient with HCG result instructed to follow up within next week, patient states the office is supposed to call her on Tuesday.

## 2016-01-04 ENCOUNTER — Encounter: Payer: Self-pay | Admitting: Physician Assistant

## 2016-01-05 ENCOUNTER — Encounter: Payer: BLUE CROSS/BLUE SHIELD | Admitting: Family Medicine

## 2016-01-05 DIAGNOSIS — Z01419 Encounter for gynecological examination (general) (routine) without abnormal findings: Secondary | ICD-10-CM

## 2016-02-06 LAB — OB RESULTS CONSOLE RUBELLA ANTIBODY, IGM: Rubella: IMMUNE

## 2016-02-06 LAB — OB RESULTS CONSOLE HEPATITIS B SURFACE ANTIGEN: Hepatitis B Surface Ag: NEGATIVE

## 2016-02-06 LAB — OB RESULTS CONSOLE GC/CHLAMYDIA
Chlamydia: NEGATIVE
Gonorrhea: NEGATIVE

## 2016-02-06 LAB — OB RESULTS CONSOLE RPR: RPR: NONREACTIVE

## 2016-02-06 LAB — OB RESULTS CONSOLE ANTIBODY SCREEN: Antibody Screen: NEGATIVE

## 2016-02-06 LAB — OB RESULTS CONSOLE ABO/RH: RH Type: POSITIVE

## 2016-02-06 LAB — OB RESULTS CONSOLE HIV ANTIBODY (ROUTINE TESTING): HIV: NONREACTIVE

## 2016-02-23 ENCOUNTER — Other Ambulatory Visit: Payer: Self-pay | Admitting: Physician Assistant

## 2016-02-25 ENCOUNTER — Other Ambulatory Visit: Payer: Self-pay

## 2016-04-06 DIAGNOSIS — Z363 Encounter for antenatal screening for malformations: Secondary | ICD-10-CM | POA: Diagnosis not present

## 2016-04-06 DIAGNOSIS — Z3480 Encounter for supervision of other normal pregnancy, unspecified trimester: Secondary | ICD-10-CM | POA: Diagnosis not present

## 2016-05-07 DIAGNOSIS — Z3A23 23 weeks gestation of pregnancy: Secondary | ICD-10-CM | POA: Diagnosis not present

## 2016-05-07 DIAGNOSIS — Z362 Encounter for other antenatal screening follow-up: Secondary | ICD-10-CM | POA: Diagnosis not present

## 2016-06-04 DIAGNOSIS — Z3480 Encounter for supervision of other normal pregnancy, unspecified trimester: Secondary | ICD-10-CM | POA: Diagnosis not present

## 2016-07-30 DIAGNOSIS — Z36 Encounter for antenatal screening for chromosomal anomalies: Secondary | ICD-10-CM | POA: Diagnosis not present

## 2016-07-30 DIAGNOSIS — Z3480 Encounter for supervision of other normal pregnancy, unspecified trimester: Secondary | ICD-10-CM | POA: Diagnosis not present

## 2016-08-16 ENCOUNTER — Encounter (HOSPITAL_COMMUNITY): Payer: Self-pay | Admitting: *Deleted

## 2016-08-16 LAB — OB RESULTS CONSOLE GBS: GBS: POSITIVE

## 2016-08-17 ENCOUNTER — Telehealth (HOSPITAL_COMMUNITY): Payer: Self-pay | Admitting: *Deleted

## 2016-08-17 NOTE — Telephone Encounter (Signed)
Preadmission screen  

## 2016-08-20 ENCOUNTER — Telehealth (HOSPITAL_COMMUNITY): Payer: Self-pay | Admitting: *Deleted

## 2016-08-20 ENCOUNTER — Encounter (HOSPITAL_COMMUNITY): Payer: Self-pay

## 2016-08-20 NOTE — Telephone Encounter (Signed)
Preadmission screen New number [phone]

## 2016-08-22 ENCOUNTER — Encounter (HOSPITAL_COMMUNITY)
Admission: RE | Admit: 2016-08-22 | Discharge: 2016-08-22 | Disposition: A | Discharge status: Home / Self Care | Payer: BLUE CROSS/BLUE SHIELD | Source: Ambulatory Visit | Attending: Obstetrics and Gynecology | Admitting: Obstetrics and Gynecology

## 2016-08-22 DIAGNOSIS — O34219 Maternal care for unspecified type scar from previous cesarean delivery: Secondary | ICD-10-CM | POA: Diagnosis not present

## 2016-08-22 DIAGNOSIS — Z833 Family history of diabetes mellitus: Secondary | ICD-10-CM | POA: Diagnosis not present

## 2016-08-22 DIAGNOSIS — O34211 Maternal care for low transverse scar from previous cesarean delivery: Secondary | ICD-10-CM | POA: Diagnosis not present

## 2016-08-22 DIAGNOSIS — Z8249 Family history of ischemic heart disease and other diseases of the circulatory system: Secondary | ICD-10-CM | POA: Diagnosis not present

## 2016-08-22 DIAGNOSIS — Z3A00 Weeks of gestation of pregnancy not specified: Secondary | ICD-10-CM | POA: Diagnosis not present

## 2016-08-22 DIAGNOSIS — Z3A39 39 weeks gestation of pregnancy: Secondary | ICD-10-CM | POA: Diagnosis not present

## 2016-08-22 DIAGNOSIS — O99824 Streptococcus B carrier state complicating childbirth: Secondary | ICD-10-CM | POA: Diagnosis not present

## 2016-08-22 DIAGNOSIS — Z87891 Personal history of nicotine dependence: Secondary | ICD-10-CM | POA: Diagnosis not present

## 2016-08-22 HISTORY — DX: Headache, unspecified: R51.9

## 2016-08-22 HISTORY — DX: Major depressive disorder, single episode, unspecified: F32.9

## 2016-08-22 HISTORY — DX: Depression, unspecified: F32.A

## 2016-08-22 HISTORY — DX: Headache: R51

## 2016-08-22 HISTORY — DX: Anxiety disorder, unspecified: F41.9

## 2016-08-22 LAB — TYPE AND SCREEN
ABO/RH(D): O POS
Antibody Screen: NEGATIVE

## 2016-08-22 LAB — ABO/RH: ABO/RH(D): O POS

## 2016-08-22 LAB — CBC
HCT: 35.1 % — ABNORMAL LOW (ref 36.0–46.0)
Hemoglobin: 11.7 g/dL — ABNORMAL LOW (ref 12.0–15.0)
MCH: 29 pg (ref 26.0–34.0)
MCHC: 33.3 g/dL (ref 30.0–36.0)
MCV: 87.1 fL (ref 78.0–100.0)
Platelets: 260 10*3/uL (ref 150–400)
RBC: 4.03 MIL/uL (ref 3.87–5.11)
RDW: 13.8 % (ref 11.5–15.5)
WBC: 11.2 10*3/uL — ABNORMAL HIGH (ref 4.0–10.5)

## 2016-08-22 MED ORDER — DEXTROSE 5 % IV SOLN
INTRAVENOUS | Status: AC
  Administered 2016-08-23: 113.5 mL via INTRAVENOUS
  Filled 2016-08-22: qty 7.5

## 2016-08-22 NOTE — Patient Instructions (Signed)
20 Lisa Dyer  08/22/2016   Your procedure is scheduled on:  08/23/2016  Enter through the Main Entrance of [facility] at 0530 AM.  Pick up the phone at the desk and dial [phone].   Call this number if you have problems the morning of surgery: [phone]   Remember:   Do not eat food:After Midnight.  Do not drink clear liquids: After Midnight.  Take these medicines the morning of surgery with A SIP OF WATER: none   Do not wear jewelry, make-up or nail polish.  Do not wear lotions, powders, or perfumes. Do not wear deodorant.  Do not shave 48 hours prior to surgery.  Do not bring valuables to the hospital.  [facility] is not   responsible for any belongings or valuables brought to the hospital.  Contacts, dentures or bridgework may not be worn into surgery.  Leave suitcase in the car. After surgery it may be brought to your room.  For patients admitted to the hospital, checkout time is 11:00 AM the day of              discharge.   Patients discharged the day of surgery will not be allowed to drive             home.  Name and phone number of your driver: na  Special Instructions:   N/A   Please read over the following fact sheets that you were given:   Surgical Site Infection Prevention

## 2016-08-23 ENCOUNTER — Inpatient Hospital Stay (HOSPITAL_COMMUNITY)
Admission: RE | Admit: 2016-08-23 | Discharge: 2016-08-25 | DRG: 766 | Disposition: A | Discharge status: Home / Self Care | Payer: BLUE CROSS/BLUE SHIELD | Source: Ambulatory Visit | Attending: Obstetrics and Gynecology | Admitting: Obstetrics and Gynecology

## 2016-08-23 ENCOUNTER — Encounter (HOSPITAL_COMMUNITY)
Admission: RE | Disposition: A | Discharge status: Home / Self Care | Payer: Self-pay | Source: Ambulatory Visit | Attending: Obstetrics and Gynecology

## 2016-08-23 ENCOUNTER — Encounter (HOSPITAL_COMMUNITY): Payer: Self-pay

## 2016-08-23 ENCOUNTER — Inpatient Hospital Stay (HOSPITAL_COMMUNITY): Payer: BLUE CROSS/BLUE SHIELD | Admitting: Anesthesiology

## 2016-08-23 DIAGNOSIS — Z87891 Personal history of nicotine dependence: Secondary | ICD-10-CM

## 2016-08-23 DIAGNOSIS — Z3A39 39 weeks gestation of pregnancy: Secondary | ICD-10-CM | POA: Diagnosis not present

## 2016-08-23 DIAGNOSIS — Z833 Family history of diabetes mellitus: Secondary | ICD-10-CM | POA: Diagnosis not present

## 2016-08-23 DIAGNOSIS — O99824 Streptococcus B carrier state complicating childbirth: Secondary | ICD-10-CM | POA: Diagnosis present

## 2016-08-23 DIAGNOSIS — Z3A00 Weeks of gestation of pregnancy not specified: Secondary | ICD-10-CM | POA: Diagnosis not present

## 2016-08-23 DIAGNOSIS — O34211 Maternal care for low transverse scar from previous cesarean delivery: Principal | ICD-10-CM | POA: Diagnosis present

## 2016-08-23 DIAGNOSIS — Z8249 Family history of ischemic heart disease and other diseases of the circulatory system: Secondary | ICD-10-CM

## 2016-08-23 DIAGNOSIS — O34219 Maternal care for unspecified type scar from previous cesarean delivery: Secondary | ICD-10-CM | POA: Diagnosis not present

## 2016-08-23 DIAGNOSIS — Z98891 History of uterine scar from previous surgery: Secondary | ICD-10-CM

## 2016-08-23 LAB — RPR: RPR Ser Ql: NONREACTIVE

## 2016-08-23 SURGERY — Surgical Case
Anesthesia: Epidural

## 2016-08-23 MED ORDER — IBUPROFEN 600 MG PO TABS: 600.0000 mg | ORAL_TABLET | Freq: Four times a day (QID) | ORAL | Status: DC

## 2016-08-23 MED ORDER — PRENATAL MULTIVITAMIN CH
1.0000 | ORAL_TABLET | Freq: Every day | ORAL | Status: DC
  Administered 2016-08-24: 1 via ORAL
  Filled 2016-08-23: qty 1

## 2016-08-23 MED ORDER — SODIUM CHLORIDE 0.9 % IJ SOLN
INTRAMUSCULAR | Status: AC
Start: 2016-08-23 — End: 2016-08-23
  Filled 2016-08-23: qty 20

## 2016-08-23 MED ORDER — PHENYLEPHRINE 40 MCG/ML (10ML) SYRINGE FOR IV PUSH (FOR BLOOD PRESSURE SUPPORT)
PREFILLED_SYRINGE | INTRAVENOUS | Status: AC
  Filled 2016-08-23: qty 40

## 2016-08-23 MED ORDER — LIDOCAINE-EPINEPHRINE (PF) 2 %-1:200000 IJ SOLN
INTRAMUSCULAR | Status: AC
  Filled 2016-08-23: qty 20

## 2016-08-23 MED ORDER — MENTHOL 3 MG MT LOZG: 1.0000 | LOZENGE | OROMUCOSAL | Status: DC | PRN

## 2016-08-23 MED ORDER — MORPHINE SULFATE (PF) 0.5 MG/ML IJ SOLN
INTRAMUSCULAR | Status: DC | PRN
  Administered 2016-08-23: 4 mg via EPIDURAL

## 2016-08-23 MED ORDER — METOCLOPRAMIDE HCL 5 MG/ML IJ SOLN
INTRAMUSCULAR | Status: DC | PRN
  Administered 2016-08-23: 10 mg via INTRAVENOUS

## 2016-08-23 MED ORDER — MEPERIDINE HCL 25 MG/ML IJ SOLN
INTRAMUSCULAR | Status: AC
  Filled 2016-08-23: qty 1

## 2016-08-23 MED ORDER — MEPERIDINE HCL 25 MG/ML IJ SOLN
INTRAMUSCULAR | Status: DC | PRN
  Administered 2016-08-23 (×2): 12.5 mg via INTRAVENOUS

## 2016-08-23 MED ORDER — ACETAMINOPHEN 500 MG PO TABS
1000.0000 mg | ORAL_TABLET | Freq: Four times a day (QID) | ORAL | Status: AC
  Administered 2016-08-23 (×2): 1000 mg via ORAL
  Filled 2016-08-23 (×2): qty 2

## 2016-08-23 MED ORDER — NALBUPHINE HCL 10 MG/ML IJ SOLN
5.0000 mg | Freq: Once | INTRAMUSCULAR | Status: AC | PRN
  Administered 2016-08-24: 5 mg via INTRAVENOUS

## 2016-08-23 MED ORDER — ONDANSETRON HCL 4 MG/2ML IJ SOLN
INTRAMUSCULAR | Status: AC
  Filled 2016-08-23: qty 2

## 2016-08-23 MED ORDER — MORPHINE SULFATE (PF) 0.5 MG/ML IJ SOLN
INTRAMUSCULAR | Status: AC
  Filled 2016-08-23: qty 10

## 2016-08-23 MED ORDER — ONDANSETRON HCL 4 MG/2ML IJ SOLN: 4.0000 mg | Freq: Three times a day (TID) | INTRAMUSCULAR | Status: DC | PRN

## 2016-08-23 MED ORDER — ZOLPIDEM TARTRATE 5 MG PO TABS: 5.0000 mg | ORAL_TABLET | Freq: Every evening | ORAL | Status: DC | PRN

## 2016-08-23 MED ORDER — NALBUPHINE HCL 10 MG/ML IJ SOLN: 5.0000 mg | Freq: Once | INTRAMUSCULAR | Status: AC | PRN

## 2016-08-23 MED ORDER — OXYCODONE-ACETAMINOPHEN 5-325 MG PO TABS
1.0000 | ORAL_TABLET | ORAL | Status: DC | PRN
  Filled 2016-08-23: qty 1

## 2016-08-23 MED ORDER — SOD CITRATE-CITRIC ACID 500-334 MG/5ML PO SOLN
ORAL | Status: AC
  Filled 2016-08-23: qty 15

## 2016-08-23 MED ORDER — SIMETHICONE 80 MG PO CHEW: 80.0000 mg | CHEWABLE_TABLET | ORAL | Status: DC | PRN

## 2016-08-23 MED ORDER — COCONUT OIL OIL: 1.0000 "application " | TOPICAL_OIL | Status: DC | PRN

## 2016-08-23 MED ORDER — EPHEDRINE SULFATE 50 MG/ML IJ SOLN
INTRAMUSCULAR | Status: DC | PRN
  Administered 2016-08-23: 5 mg via INTRAVENOUS

## 2016-08-23 MED ORDER — SENNOSIDES-DOCUSATE SODIUM 8.6-50 MG PO TABS
2.0000 | ORAL_TABLET | ORAL | Status: DC
  Administered 2016-08-23 – 2016-08-24 (×2): 2 via ORAL
  Filled 2016-08-23 (×2): qty 2

## 2016-08-23 MED ORDER — METOCLOPRAMIDE HCL 5 MG/ML IJ SOLN
INTRAMUSCULAR | Status: AC
  Filled 2016-08-23: qty 2

## 2016-08-23 MED ORDER — ESCITALOPRAM OXALATE 10 MG PO TABS
10.0000 mg | ORAL_TABLET | Freq: Every day | ORAL | Status: DC
  Filled 2016-08-23 (×3): qty 1

## 2016-08-23 MED ORDER — OXYTOCIN 40 UNITS IN LACTATED RINGERS INFUSION - SIMPLE MED: 2.5000 [IU]/h | INTRAVENOUS | Status: AC

## 2016-08-23 MED ORDER — TETANUS-DIPHTH-ACELL PERTUSSIS 5-2.5-18.5 LF-MCG/0.5 IM SUSP: 0.5000 mL | Freq: Once | INTRAMUSCULAR | Status: DC

## 2016-08-23 MED ORDER — NALBUPHINE HCL 10 MG/ML IJ SOLN: 5.0000 mg | INTRAMUSCULAR | Status: DC | PRN

## 2016-08-23 MED ORDER — NALOXONE HCL 0.4 MG/ML IJ SOLN
INTRAMUSCULAR | Status: AC
  Filled 2016-08-23: qty 1

## 2016-08-23 MED ORDER — FENTANYL CITRATE (PF) 100 MCG/2ML IJ SOLN
INTRAMUSCULAR | Status: AC
  Filled 2016-08-23: qty 2

## 2016-08-23 MED ORDER — SIMETHICONE 80 MG PO CHEW
80.0000 mg | CHEWABLE_TABLET | Freq: Three times a day (TID) | ORAL | Status: DC
  Administered 2016-08-23 – 2016-08-25 (×5): 80 mg via ORAL
  Filled 2016-08-23 (×5): qty 1

## 2016-08-23 MED ORDER — LIDOCAINE-EPINEPHRINE 2 %-1:100000 IJ SOLN
INTRAMUSCULAR | Status: DC | PRN
  Administered 2016-08-23: 3 mL
  Administered 2016-08-23: 5 mL
  Administered 2016-08-23: 2 mL
  Administered 2016-08-23 (×2): 5 mL

## 2016-08-23 MED ORDER — FAMOTIDINE 20 MG PO TABS
20.0000 mg | ORAL_TABLET | Freq: Once | ORAL | Status: AC
  Administered 2016-08-23: 20 mg via ORAL
  Filled 2016-08-23: qty 1

## 2016-08-23 MED ORDER — DIPHENHYDRAMINE HCL 25 MG PO CAPS: 25.0000 mg | ORAL_CAPSULE | Freq: Four times a day (QID) | ORAL | Status: DC | PRN

## 2016-08-23 MED ORDER — SOD CITRATE-CITRIC ACID 500-334 MG/5ML PO SOLN
30.0000 mL | Freq: Once | ORAL | Status: AC
  Administered 2016-08-23: 30 mL via ORAL

## 2016-08-23 MED ORDER — DIPHENHYDRAMINE HCL 50 MG/ML IJ SOLN
INTRAMUSCULAR | Status: AC
  Filled 2016-08-23: qty 1

## 2016-08-23 MED ORDER — LACTATED RINGERS IV SOLN
INTRAVENOUS | Status: DC
  Administered 2016-08-23 (×3): via INTRAVENOUS

## 2016-08-23 MED ORDER — SCOPOLAMINE 1 MG/3DAYS TD PT72
1.0000 | MEDICATED_PATCH | Freq: Once | TRANSDERMAL | Status: DC
  Administered 2016-08-23: 1.5 mg via TRANSDERMAL
  Filled 2016-08-23: qty 1

## 2016-08-23 MED ORDER — ACETAMINOPHEN 325 MG PO TABS: 650.0000 mg | ORAL_TABLET | ORAL | Status: DC | PRN

## 2016-08-23 MED ORDER — SODIUM CHLORIDE 0.9% FLUSH: 3.0000 mL | INTRAVENOUS | Status: DC | PRN

## 2016-08-23 MED ORDER — DIPHENHYDRAMINE HCL 50 MG/ML IJ SOLN
INTRAMUSCULAR | Status: DC | PRN
  Administered 2016-08-23: 25 mg via INTRAVENOUS

## 2016-08-23 MED ORDER — LACTATED RINGERS IV SOLN
INTRAVENOUS | Status: DC
  Administered 2016-08-23: 20:00:00 via INTRAVENOUS

## 2016-08-23 MED ORDER — DEXAMETHASONE SODIUM PHOSPHATE 4 MG/ML IJ SOLN
INTRAMUSCULAR | Status: DC | PRN
  Administered 2016-08-23: 4 mg via INTRAVENOUS

## 2016-08-23 MED ORDER — OXYTOCIN 10 UNIT/ML IJ SOLN
INTRAMUSCULAR | Status: AC
  Filled 2016-08-23: qty 4

## 2016-08-23 MED ORDER — DIBUCAINE 1 % RE OINT: 1.0000 "application " | TOPICAL_OINTMENT | RECTAL | Status: DC | PRN

## 2016-08-23 MED ORDER — OXYCODONE-ACETAMINOPHEN 5-325 MG PO TABS
2.0000 | ORAL_TABLET | ORAL | Status: DC | PRN
  Administered 2016-08-23 – 2016-08-25 (×8): 2 via ORAL
  Filled 2016-08-23 (×9): qty 2

## 2016-08-23 MED ORDER — CEFAZOLIN SODIUM-DEXTROSE 2-4 GM/100ML-% IV SOLN
INTRAVENOUS | Status: AC
  Filled 2016-08-23: qty 100

## 2016-08-23 MED ORDER — NALOXONE HCL 2 MG/2ML IJ SOSY
1.0000 ug/kg/h | PREFILLED_SYRINGE | INTRAMUSCULAR | Status: DC | PRN
  Filled 2016-08-23: qty 2

## 2016-08-23 MED ORDER — EPHEDRINE 5 MG/ML INJ
INTRAVENOUS | Status: AC
  Filled 2016-08-23: qty 10

## 2016-08-23 MED ORDER — PHENYLEPHRINE 40 MCG/ML (10ML) SYRINGE FOR IV PUSH (FOR BLOOD PRESSURE SUPPORT)
PREFILLED_SYRINGE | INTRAVENOUS | Status: AC
  Filled 2016-08-23: qty 10

## 2016-08-23 MED ORDER — NALBUPHINE HCL 10 MG/ML IJ SOLN
5.0000 mg | INTRAMUSCULAR | Status: DC | PRN
  Administered 2016-08-23 – 2016-08-24 (×5): 5 mg via INTRAVENOUS
  Filled 2016-08-23 (×7): qty 1

## 2016-08-23 MED ORDER — MEPERIDINE HCL 25 MG/ML IJ SOLN: 6.2500 mg | INTRAMUSCULAR | Status: DC | PRN

## 2016-08-23 MED ORDER — WITCH HAZEL-GLYCERIN EX PADS: 1.0000 "application " | MEDICATED_PAD | CUTANEOUS | Status: DC | PRN

## 2016-08-23 MED ORDER — DEXAMETHASONE SODIUM PHOSPHATE 4 MG/ML IJ SOLN
INTRAMUSCULAR | Status: AC
  Filled 2016-08-23: qty 1

## 2016-08-23 MED ORDER — NALOXONE HCL 0.4 MG/ML IJ SOLN: 0.4000 mg | INTRAMUSCULAR | Status: DC | PRN

## 2016-08-23 MED ORDER — DIPHENHYDRAMINE HCL 25 MG PO CAPS
25.0000 mg | ORAL_CAPSULE | ORAL | Status: DC | PRN
  Administered 2016-08-24 – 2016-08-25 (×2): 25 mg via ORAL
  Filled 2016-08-23 (×2): qty 1

## 2016-08-23 MED ORDER — SIMETHICONE 80 MG PO CHEW
80.0000 mg | CHEWABLE_TABLET | ORAL | Status: DC
  Administered 2016-08-23 – 2016-08-24 (×2): 80 mg via ORAL
  Filled 2016-08-23 (×2): qty 1

## 2016-08-23 MED ORDER — OXYTOCIN 10 UNIT/ML IJ SOLN
INTRAVENOUS | Status: DC | PRN
  Administered 2016-08-23: 40 [IU] via INTRAVENOUS

## 2016-08-23 MED ORDER — FENTANYL CITRATE (PF) 100 MCG/2ML IJ SOLN
25.0000 ug | INTRAMUSCULAR | Status: DC | PRN
  Administered 2016-08-23: 25 ug via INTRAVENOUS

## 2016-08-23 MED ORDER — PROMETHAZINE HCL 25 MG/ML IJ SOLN: 6.2500 mg | INTRAMUSCULAR | Status: DC | PRN

## 2016-08-23 MED ORDER — PHENYLEPHRINE HCL 10 MG/ML IJ SOLN
INTRAMUSCULAR | Status: DC | PRN
  Administered 2016-08-23 (×2): 40 ug via INTRAVENOUS
  Administered 2016-08-23 (×6): 80 ug via INTRAVENOUS
  Administered 2016-08-23 (×2): 40 ug via INTRAVENOUS
  Administered 2016-08-23 (×4): 80 ug via INTRAVENOUS

## 2016-08-23 MED ORDER — DIPHENHYDRAMINE HCL 50 MG/ML IJ SOLN: 12.5000 mg | INTRAMUSCULAR | Status: DC | PRN

## 2016-08-23 SURGICAL SUPPLY — 34 items
BARRIER ADHS 3X4 INTERCEED (GAUZE/BANDAGES/DRESSINGS) IMPLANT
BENZOIN TINCTURE PRP APPL 2/3 (GAUZE/BANDAGES/DRESSINGS) ×3 IMPLANT
CHLORAPREP W/TINT 26ML (MISCELLANEOUS) ×3 IMPLANT
CLAMP CORD UMBIL (MISCELLANEOUS) IMPLANT
CLOSURE STERI STRIP 1/2 X4 (GAUZE/BANDAGES/DRESSINGS) ×3 IMPLANT
CLOTH BEACON ORANGE TIMEOUT ST (SAFETY) ×3 IMPLANT
CONTAINER PREFILL 10% NBF 15ML (MISCELLANEOUS) IMPLANT
DRSG OPSITE POSTOP 4X10 (GAUZE/BANDAGES/DRESSINGS) ×3 IMPLANT
ELECT REM PT RETURN 9FT ADLT (ELECTROSURGICAL) ×3
ELECTRODE REM PT RTRN 9FT ADLT (ELECTROSURGICAL) ×1 IMPLANT
EXTRACTOR VACUUM M CUP 4 TUBE (SUCTIONS) IMPLANT
EXTRACTOR VACUUM M CUP 4' TUBE (SUCTIONS)
GLOVE BIO SURGEON STRL SZ 6.5 (GLOVE) ×2 IMPLANT
GLOVE BIO SURGEONS STRL SZ 6.5 (GLOVE) ×1
GLOVE BIOGEL PI IND STRL 7.0 (GLOVE) ×1 IMPLANT
GLOVE BIOGEL PI INDICATOR 7.0 (GLOVE) ×2
GOWN STRL REUS W/TWL LRG LVL3 (GOWN DISPOSABLE) ×6 IMPLANT
HEMOSTAT ARISTA ABSORB 3G PWDR (MISCELLANEOUS) ×3 IMPLANT
KIT ABG SYR 3ML LUER SLIP (SYRINGE) IMPLANT
NEEDLE HYPO 22GX1.5 SAFETY (NEEDLE) IMPLANT
NEEDLE HYPO 25X5/8 SAFETYGLIDE (NEEDLE) ×3 IMPLANT
NS IRRIG 1000ML POUR BTL (IV SOLUTION) ×3 IMPLANT
PACK C SECTION WH (CUSTOM PROCEDURE TRAY) ×3 IMPLANT
PAD OB MATERNITY 4.3X12.25 (PERSONAL CARE ITEMS) ×3 IMPLANT
PENCIL SMOKE EVAC W/HOLSTER (ELECTROSURGICAL) ×3 IMPLANT
SUT CHROMIC 0 CTX 36 (SUTURE) ×6 IMPLANT
SUT PLAIN 0 NONE (SUTURE) IMPLANT
SUT PLAIN 2 0 XLH (SUTURE) IMPLANT
SUT VIC AB 0 CT1 27 (SUTURE) ×6
SUT VIC AB 0 CT1 27XBRD ANBCTR (SUTURE) ×3 IMPLANT
SUT VIC AB 4-0 KS 27 (SUTURE) IMPLANT
SYR CONTROL 10ML LL (SYRINGE) IMPLANT
TOWEL OR 17X24 6PK STRL BLUE (TOWEL DISPOSABLE) ×3 IMPLANT
TRAY FOLEY BAG SILVER LF 14FR (SET/KITS/TRAYS/PACK) ×3 IMPLANT

## 2016-08-23 NOTE — Anesthesia Procedure Notes (Addendum)
Epidural Patient location during procedure: OR Start time: 08/23/2016 7:28 AM End time: 08/23/2016 7:33 AM  Staffing Anesthesiologist: Cecile Hearing Performed: anesthesiologist   Preanesthetic Checklist Completed: patient identified, pre-op evaluation, timeout performed, IV checked, risks and benefits discussed and monitors and equipment checked  Epidural Patient position: sitting Prep: DuraPrep Patient monitoring: blood pressure and continuous pulse ox Approach: midline Location: L3-L4 Injection technique: LOR air  Needle:  Needle type: Tuohy  Needle gauge: 17 G Needle length: 9 cm Needle insertion depth: 5 cm Catheter size: 19 Gauge Catheter at skin depth: 10 cm Test dose: negative and Other (1% Lidocaine)  Assessment Sensory level: T4  Additional Notes Patient identified.  Risk benefits discussed including failed block, incomplete pain control, headache, nerve damage, paralysis, blood pressure changes, nausea, vomiting, reactions to medication both toxic or allergic, and postpartum back pain.  Patient expressed understanding and wished to proceed.  All questions were answered.  Sterile technique used throughout procedure and epidural site dressed with sterile barrier dressing. No paresthesia or other complications noted. The patient did not experience any signs of intravascular injection such as tinnitus or metallic taste in mouth nor signs of intrathecal spread such as rapid motor block. Please see nursing notes for vital signs. Reason for block:surgical anesthesia

## 2016-08-23 NOTE — Addendum Note (Signed)
Addendum  created 08/23/16 1703 by Algis Greenhouse, CRNA   Sign clinical note

## 2016-08-23 NOTE — Brief Op Note (Signed)
08/23/2016  8:39 AM  PATIENT:  Lisa Dyer  31 y.o. female  PRE-OPERATIVE DIAGNOSIS:   IUP at 39 weeks Previous Cesarean Section  POST-OPERATIVE DIAGNOSIS:   Same  PROCEDURE:  Procedure(s) with comments: REPEAT CESAREAN SECTION (N/A) - Repeat -edc 5/3 -allergy to PCN, NSAIDS, tramadol need RNFA - Herbert Seta K to RNFA  SURGEON:  Surgeon(s) and Role:    * Marcelle Overlie, MD - Primary  PHYSICIAN ASSISTANT:   ASSISTANTS: none   ANESTHESIA:   spinal  EBL:  Total I/O In: 2000 [I.V.:2000] Out: 800 [Urine:100; Blood:700]  BLOOD ADMINISTERED:none  DRAINS: Urinary Catheter (Foley)   LOCAL MEDICATIONS USED:  NONE  SPECIMEN:  No Specimen  DISPOSITION OF SPECIMEN:  N/A  COUNTS:  YES  TOURNIQUET:  * No tourniquets in log *  DICTATION: .Other Dictation: Dictation Number [id number]  PLAN OF CARE: Other Dictation: Dictation Number [id number]  PATIENT DISPOSITION:  PACU - hemodynamically stable.   Delay start of Pharmacological VTE agent (>24hrs) due to surgical blood loss or risk of bleeding: yes

## 2016-08-23 NOTE — Lactation Note (Signed)
This note was copied from a baby's chart. Lactation Consultation Note  Patient Name: Lisa Dyer [id number] Date: 08/23/2016   Baby 9 hours old. Mom's bedside nurse, Ted Mcalpine states mom requesting information regarding compatibility of "Lexapro" and breastfeeding. "Lexapro" an L2 (Limited Data - Probably Compatible) according to "Medications and Mothers' Milk," by Bobbye Morton. Mom enc to alert baby's pediatrician to mom's use of this medication and monitoring of "sedation or irritability, not waking to feed/poor feeding and weight gain."  Maternal Data    Feeding    [facility] Score/Interventions                      Lactation Tools Discussed/Used     Consult Status      Sherlyn Hay 08/23/2016, 5:35 PM

## 2016-08-23 NOTE — Lactation Note (Signed)
This note was copied from a baby's chart. Lactation Consultation Note  Patient Name: Lisa Dyer [id number] Date: 08/23/2016 Reason for consult: Initial assessment Baby at 4 hr of life. Upon entry baby was swaddled in visitor's arms. Mom reports baby latched well in PACU. Mom declined latch help at this visit. Her sister is in the room and "bf each of her children for 86yr so she will help me". Discussed baby behavior, feeding frequency, baby belly size, voids, wt loss, breast changes, and nipple care. Stated she can manually express and has spoon in room. Given lactation handouts. Aware of OP services and support group.     Maternal Data Has patient been taught Hand Expression?: Yes Does the patient have breastfeeding experience prior to this delivery?: No  Feeding Feeding Type: Breast Fed Length of feed: 30 min  LATCH Score/Interventions Latch: Grasps breast easily, tongue down, lips flanged, rhythmical sucking.  Audible Swallowing: Spontaneous and intermittent  Type of Nipple: Everted at rest and after stimulation  Comfort (Breast/Nipple): Soft / non-tender     Hold (Positioning): Assistance needed to correctly position infant at breast and maintain latch.  LATCH Score: 9  Lactation Tools Discussed/Used WIC Program: No   Consult Status Consult Status: Follow-up Date: 08/24/16 Follow-up type: In-patient    Rulon Eisenmenger 08/23/2016, 12:27 PM

## 2016-08-23 NOTE — Anesthesia Preprocedure Evaluation (Signed)
Anesthesia Evaluation  Patient identified by MRN, date of birth, ID band Patient awake    Reviewed: Allergy & Precautions, NPO status , Patient's Chart, lab work & pertinent test results  Airway Mallampati: II  TM Distance: >3 FB Neck ROM: Full    Dental  (+) Teeth Intact, Dental Advisory Given   Pulmonary asthma , former smoker,    Pulmonary exam normal breath sounds clear to auscultation       Cardiovascular Exercise Tolerance: Good negative cardio ROS Normal cardiovascular exam Rhythm:Regular Rate:Normal     Neuro/Psych  Headaches, PSYCHIATRIC DISORDERS Anxiety Depression  Neuromuscular disease    GI/Hepatic negative GI ROS, Neg liver ROS,   Endo/Other  negative endocrine ROSObesity   Renal/GU negative Renal ROS     Musculoskeletal negative musculoskeletal ROS (+)   Abdominal   Peds  Hematology  (+) Blood dyscrasia, anemia , Plt 260k   Anesthesia Other Findings Day of surgery medications reviewed with the patient.  Reproductive/Obstetrics (+) Pregnancy Previous C-section x1                             Anesthesia Physical Anesthesia Plan  ASA: II  Anesthesia Plan: Epidural   Post-op Pain Management:    Induction:   Airway Management Planned: Nasal Cannula  Additional Equipment:   Intra-op Plan:   Post-operative Plan:   Informed Consent: I have reviewed the patients History and Physical, chart, labs and discussed the procedure including the risks, benefits and alternatives for the proposed anesthesia with the patient or authorized representative who has indicated his/her understanding and acceptance.   Dental advisory given  Plan Discussed with: CRNA, Anesthesiologist and Surgeon  Anesthesia Plan Comments: (Epidural in OR. Will dose with 2% Lidocaine.)        Anesthesia Quick Evaluation

## 2016-08-23 NOTE — Anesthesia Postprocedure Evaluation (Signed)
Anesthesia Post Note  Patient: Lisa Dyer  Procedure(s) Performed: Procedure(s) (LRB): REPEAT CESAREAN SECTION (N/A)  Patient location during evaluation: Mother Baby Anesthesia Type: Epidural Level of consciousness: awake and alert Pain management: pain level controlled Vital Signs Assessment: post-procedure vital signs reviewed and stable Respiratory status: spontaneous breathing, nonlabored ventilation and respiratory function stable Cardiovascular status: stable Postop Assessment: no headache, no backache, epidural receding, patient able to bend at knees and no signs of nausea or vomiting Anesthetic complications: no        Last Vitals:  Vitals:   08/23/16 0915 08/23/16 0930  BP: 114/63 108/66  Pulse: 81 81  Resp: 18 20  Temp:      Last Pain:  Vitals:   08/23/16 0855  TempSrc: Oral   Pain Goal:                 Cecile Hearing

## 2016-08-23 NOTE — H&P (Signed)
Lisa Dyer is a 31 year old G 5 P 1031 at 39 weeks presents for C Section. OB History    Gravida Para Term Preterm AB Living   SAB TAB Ectopic Multiple Live Births   Past Medical History:  Diagnosis Date  . Anxiety   . Asthma   . Chicken pox   . Depression    situational depression dcd all meds  . Headache    Past Surgical History:  Procedure Laterality Date  . BREAST SURGERY    . CESAREAN SECTION    . fallopian tube removal    . HERNIA REPAIR     Family History: family history includes Arthritis in her maternal grandmother; Cancer in her paternal grandfather and paternal grandmother; Diabetes in her maternal aunt, maternal uncle, mother, and paternal grandmother; Heart disease in her maternal aunt, maternal grandfather, and maternal uncle; Hyperlipidemia in her father, maternal aunt, maternal grandfather, and maternal uncle; Hypertension in her father, maternal grandfather, maternal grandmother, mother, and paternal uncle; Stroke in her maternal grandmother and paternal grandfather; Thyroid disease in her mother. Social History:  reports that she quit smoking about 9 months ago. She has never used smokeless tobacco. She reports that she drinks alcohol. She reports that she does not use drugs.     Maternal Diabetes: No Genetic Screening: Normal Maternal Ultrasounds/Referrals: Normal Fetal Ultrasounds or other Referrals:  None Maternal Substance Abuse:  No Significant Maternal Medications:  None Significant Maternal Lab Results:  None Other Comments:  None  Review of Systems  All other systems reviewed and are negative.  History   Blood pressure (!) 125/58, pulse 83, temperature 97.9 F (36.6 C), temperature source Oral, resp. rate 18, height 5' (1.524 m), weight 80.7 kg (178 lb), last menstrual period 11/24/2015, SpO2 99 %. Maternal Exam:  Abdomen: Fetal presentation: vertex     Physical Exam  Nursing note and vitals  reviewed. Constitutional: She appears well-developed and well-nourished.  HENT:  Head: Normocephalic.  Eyes: Pupils are equal, round, and reactive to light.  Neck: Normal range of motion.  Cardiovascular: Normal rate and regular rhythm.   Respiratory: Effort normal.  GI: Soft.    Prenatal labs: ABO, Rh: --/--/O POS, O POS (04/25 1140) Antibody: NEG (04/25 1140) Rubella: Immune (10/09 0000) RPR: Non Reactive (04/25 1140)  HBsAg: Negative (10/09 0000)  HIV: Non-reactive (10/09 0000)  GBS: Positive (04/19 0000)   Assessment/Plan: IUP at 39 weeks Previous C Section Repeat LTCS Risks reviewed Consent signed   GREWAL,MICHELLE L 08/23/2016, 7:16 AM

## 2016-08-23 NOTE — Progress Notes (Signed)
MOB was referred for history of depression/anxiety. * Referral screened out by Clinical Social Worker because none of the following criteria appear to apply: ~ History of anxiety/depression during this pregnancy, or of post-partum depression. ~ Diagnosis of anxiety and/or depression within last 3 years OR * MOB's symptoms currently being treated with medication and/or therapy. Please contact the Clinical Social Worker if needs arise, or if MOB requests.   

## 2016-08-23 NOTE — Consult Note (Signed)
Neonatology Note:   Attendance at C-section:    I was asked by Dr. Vincente Poli to attend this repeat C/S at term. The mother is a [id number], GBS + with good prenatal care. No labor. Received [employer].  ROM at delivery, fluid clear. Infant vigorous with good spontaneous cry and tone. Needed only minimal bulb suctioning. Ap 8/9. Lungs clear to ausc in DR. To CN to care of Pediatrician.  Dineen Kid Leary Roca, MD

## 2016-08-23 NOTE — Op Note (Signed)
[name], Lisa Dyer NO.:  [id number]  MEDICAL RECORD NO.:  [id number]  LOCATION:                                 FACILITY:  PHYSICIAN:  Michelle L. Vincente Poli, M.D.    DATE OF BIRTH:  DATE OF PROCEDURE: DATE OF DISCHARGE:                              OPERATIVE REPORT   PREOPERATIVE DIAGNOSIS:  Intrauterine pregnancy at 39 weeks with previous cesarean section.  POSTOPERATIVE DIAGNOSIS:  Intrauterine pregnancy at 39 weeks with previous cesarean section.  PROCEDURE:  Repeat low transverse cesarean section.  SURGEON:  Michelle L. Vincente Poli, M.D.  ANESTHESIA:  Spinal.  EBL:  700 mL.  COMPLICATIONS:  None.  DRAINS:  Foley.  DESCRIPTION OF PROCEDURE:  The patient was taken to the operating room. Her spinal was placed.  She was prepped and draped.  A Foley catheter was inserted and time-out was performed.  A low transverse incision was made, carried down to the fascia.  Fascia scored in the midline, extended laterally.  The rectus muscles were separated in the midline. The peritoneum was entered bluntly.  The peritoneal incision was then stretched.  The lower uterine segment was identified.  The bladder flap was created sharply and then digitally.  A low transverse incision was made in the uterus.  The uterus was entered using a hemostat.  The baby was delivered easily with 1 pull of the vacuum.  The baby was a female infant.  The cord was clamped and cut.  The baby was handed to the awaiting Neonatal Team.  The uterus was then cleared of all clots and debris.  It was then closed in one layer using 0 chromic in a running locked stitch.  There were some adhesions involving the midportion of the uterus to the anterior abdominal wall, which I did not take down because I felt like that would cause excessive bleeding.  We could not exteriorize the uterus.  There was a little bit of oozing around the incision bed.  I placed Arista and hemostasis was then excellent.   The peritoneum was closed using 0 Vicryl.  The fascia was then closed using 0 Vicryl starting at the corner and meeting in the midline.  The skin was closed with 3-0 Vicryl on a Keith needle.  Steri-Strips and honeycomb were applied.  All sponge, lap, and instrument counts were correct x2.     Michelle L. Vincente Poli, M.D.     [name]  D:  08/23/2016  T:  08/23/2016  Job:  [id number]

## 2016-08-23 NOTE — Transfer of Care (Signed)
Immediate Anesthesia Transfer of Care Note  Patient: Lisa Dyer  Procedure(s) Performed: Procedure(s) with comments: REPEAT CESAREAN SECTION (N/A) - Repeat -edc 5/3 -allergy to PCN, NSAIDS, tramadol need RNFA - [name] to RNFA  Patient Location: PACU  Anesthesia Type:Epidural  Level of Consciousness: awake  Airway & Oxygen Therapy: Patient Spontanous Breathing  Post-op Assessment: Report given to RN  Post vital signs: Reviewed and stable  Last Vitals:  Vitals:   08/23/16 0553  BP: (!) 125/58  Pulse: 83  Resp: 18  Temp: 36.6 C    Last Pain:  Vitals:   08/23/16 0553  TempSrc: Oral         Complications: No apparent anesthesia complications

## 2016-08-23 NOTE — Anesthesia Postprocedure Evaluation (Signed)
Anesthesia Post Note  Patient: Lisa Dyer  Procedure(s) Performed: Procedure(s) (LRB): REPEAT CESAREAN SECTION (N/A)  Patient location during evaluation: Mother Baby Anesthesia Type: Epidural Level of consciousness: awake Pain management: satisfactory to patient Vital Signs Assessment: post-procedure vital signs reviewed and stable Respiratory status: spontaneous breathing Cardiovascular status: stable Anesthetic complications: no        Last Vitals:  Vitals:   08/23/16 1215 08/23/16 1315  BP: (!) 102/56 (!) 103/53  Pulse: 79 89  Resp: 18 17  Temp:      Last Pain:  Vitals:   08/23/16 1315  TempSrc:   PainSc: 5    Pain Goal:                 [employer]

## 2016-08-24 ENCOUNTER — Encounter (HOSPITAL_COMMUNITY): Payer: Self-pay | Admitting: Obstetrics and Gynecology

## 2016-08-24 LAB — CBC
HCT: 29.7 % — ABNORMAL LOW (ref 36.0–46.0)
Hemoglobin: 9.9 g/dL — ABNORMAL LOW (ref 12.0–15.0)
MCH: 29.1 pg (ref 26.0–34.0)
MCHC: 33.3 g/dL (ref 30.0–36.0)
MCV: 87.4 fL (ref 78.0–100.0)
Platelets: 244 10*3/uL (ref 150–400)
RBC: 3.4 MIL/uL — ABNORMAL LOW (ref 3.87–5.11)
RDW: 13.8 % (ref 11.5–15.5)
WBC: 12.4 10*3/uL — ABNORMAL HIGH (ref 4.0–10.5)

## 2016-08-24 LAB — BIRTH TISSUE RECOVERY COLLECTION (PLACENTA DONATION)

## 2016-08-24 MED ORDER — OXYCODONE HCL 5 MG PO TABS
5.0000 mg | ORAL_TABLET | Freq: Once | ORAL | Status: AC
  Administered 2016-08-24: 5 mg via ORAL
  Filled 2016-08-24: qty 1

## 2016-08-24 NOTE — Lactation Note (Signed)
This note was copied from a baby's chart. Lactation Consultation Note  PatientGorgeous Newlunourtney Dyer [id number] Date: 08/24/2016 Reason for consult: Follow-up assessment  Follow-up with pt at 35 hrs old; GA 39.0; BW 7 lbs, 11.8 oz.  2% weight loss.  Mom is a P2 with 3 month BF experience with first child. C-section.   Infant has breastfed x12 (20-60 min) + attempt x1 (0 min); voids-6 in 24 hrs and life; stools-5 in 24 hrs and life. Mom has Hx of Breast Augmentation. Mom reports infant cluster feeding last night and reports infant is breastfeeding well.  Reports minor nipple tenderness but denies pain with breastfeeding. Reviewed continuing to feed with cues, cluster feeding with growth spurts, and supply and demand for milk volume. Encouraged mom to continue exclusive breastfeeding and discussed benefits of exclusive breastfeeding (mom mentioned she has formula at home people have given her and wanted to know how it could interfere with BM). Encouraged to call for assistance or questions as needed.   Mom has Medela DEBP at home.   Lactation to see PRN.    Maternal Data    Feeding Feeding Type: Breast Fed Length of feed: 60 min  LATCH Score/Interventions                      Lactation Tools Discussed/Used     Consult Status Consult Status: PRN    Lendon Ka 08/24/2016, 7:23 PM

## 2016-08-24 NOTE — Addendum Note (Signed)
Addendum  created 08/24/16 1428 by Cecile Hearing, MD   Anesthesia Event edited, Anesthesia Staff edited

## 2016-08-24 NOTE — Progress Notes (Signed)
Subjective: Postpartum Day 1: Cesarean Delivery Patient reports tolerating PO and no problems voiding.    Objective: Vital signs in last 24 hours: Temp:  [98 F (36.7 C)-98.7 F (37.1 C)] 98.3 F (36.8 C) (04/27 0522) Pulse Rate:  [62-94] 64 (04/27 0522) Resp:  [14-20] 18 (04/27 0522) BP: (93-114)/(47-75) 102/49 (04/27 0522) SpO2:  [94 %-98 %] 95 % (04/27 0522)  Physical Exam:  General: alert and cooperative Lochia: appropriate Uterine Fundus: firm Incision: healing well DVT Evaluation: No evidence of DVT seen on physical exam. Negative Homan's sign. No cords or calf tenderness. No significant calf/ankle edema.   Recent Labs  08/22/16 1140 08/24/16 0524  HGB 11.7* 9.9*  HCT 35.1* 29.7*    Assessment/Plan: Status post Cesarean section. Doing well postoperatively.  Continue current care.  CURTIS,CAROL G 08/24/2016, 8:48 AM

## 2016-08-24 NOTE — Lactation Note (Signed)
This note was copied from a baby's chart. Lactation Consultation Note  Mother allowing baby to suck on her finger after breastfeeding on L breast. Offered to help latch on other breast but mother states she wants to take a shower. Suggest she call if she needs assistance.   Patient Name: Lisa Dyer [id number] Date: 08/24/2016 Reason for consult: Follow-up assessment   Maternal Data    Feeding    LATCH Score/Interventions                      Lactation Tools Discussed/Used     Consult Status Consult Status: PRN    Hardie Pulley 08/24/2016, 10:01 PM

## 2016-08-25 MED ORDER — OXYCODONE-ACETAMINOPHEN 5-325 MG PO TABS: 1.0000 | ORAL_TABLET | ORAL | 0 refills | Status: DC | PRN

## 2016-08-25 NOTE — Lactation Note (Signed)
This note was copied from a baby's chart. Lactation Consultation Note: Infant crying when I entered the room. Mother getting ready to breastfeed. Mother quickly denied having any breastfeeding concerns and reports breastfeeding going well. Mother aware of available LC services.   Patient Name: Lisa Dyer [id number] Date: 08/25/2016     Maternal Data    Feeding    LATCH Score/Interventions                      Lactation Tools Discussed/Used     Consult Status      Michel Bickers 08/25/2016, 11:46 AM

## 2016-08-25 NOTE — Discharge Summary (Signed)
Obstetric Discharge Summary Reason for Admission: cesarean section Prenatal Procedures: none Intrapartum Procedures: cesarean: low cervical, transverse Postpartum Procedures: none Complications-Operative and Postpartum: none Hemoglobin  Date Value Ref Range Status  08/24/2016 9.9 (L) 12.0 - 15.0 g/dL Final   HCT  Date Value Ref Range Status  08/24/2016 29.7 (L) 36.0 - 46.0 % Final    Physical Exam:  General: alert Lochia: appropriate Uterine Fundus: firm Incision: healing well DVT Evaluation: No evidence of DVT seen on physical exam.  Discharge Diagnoses: Term Pregnancy-delivered  Discharge Information: Date: 08/25/2016 Activity: pelvic rest Diet: routine Medications: PNV and Percocet Condition: stable Instructions: refer to practice specific booklet Discharge to: home Follow-up Information    [facility]. Schedule an appointment as soon as possible for a visit in 10 day(s).   Contact information: [address] [place] [address] [phone]           Newborn Data: Live born female  Birth Weight: 7 lb 11.8 oz (3510 g) APGAR: 8, 9  Home with mother.  Lisa Dyer 08/25/2016, 7:45 AM

## 2016-11-05 DIAGNOSIS — Z1389 Encounter for screening for other disorder: Secondary | ICD-10-CM | POA: Diagnosis not present

## 2017-03-02 ENCOUNTER — Other Ambulatory Visit: Payer: Self-pay

## 2017-09-18 ENCOUNTER — Encounter: Payer: Self-pay | Admitting: General Practice

## 2017-10-08 IMAGING — CR DG CHEST 2V
2 series · 2 of 2 positions shown · non-contrast
Comparison: None.

CLINICAL DATA: Two day history of chest pain. Urticaria for 2 days.

EXAM:
CHEST  2 VIEW

[w chest pa]
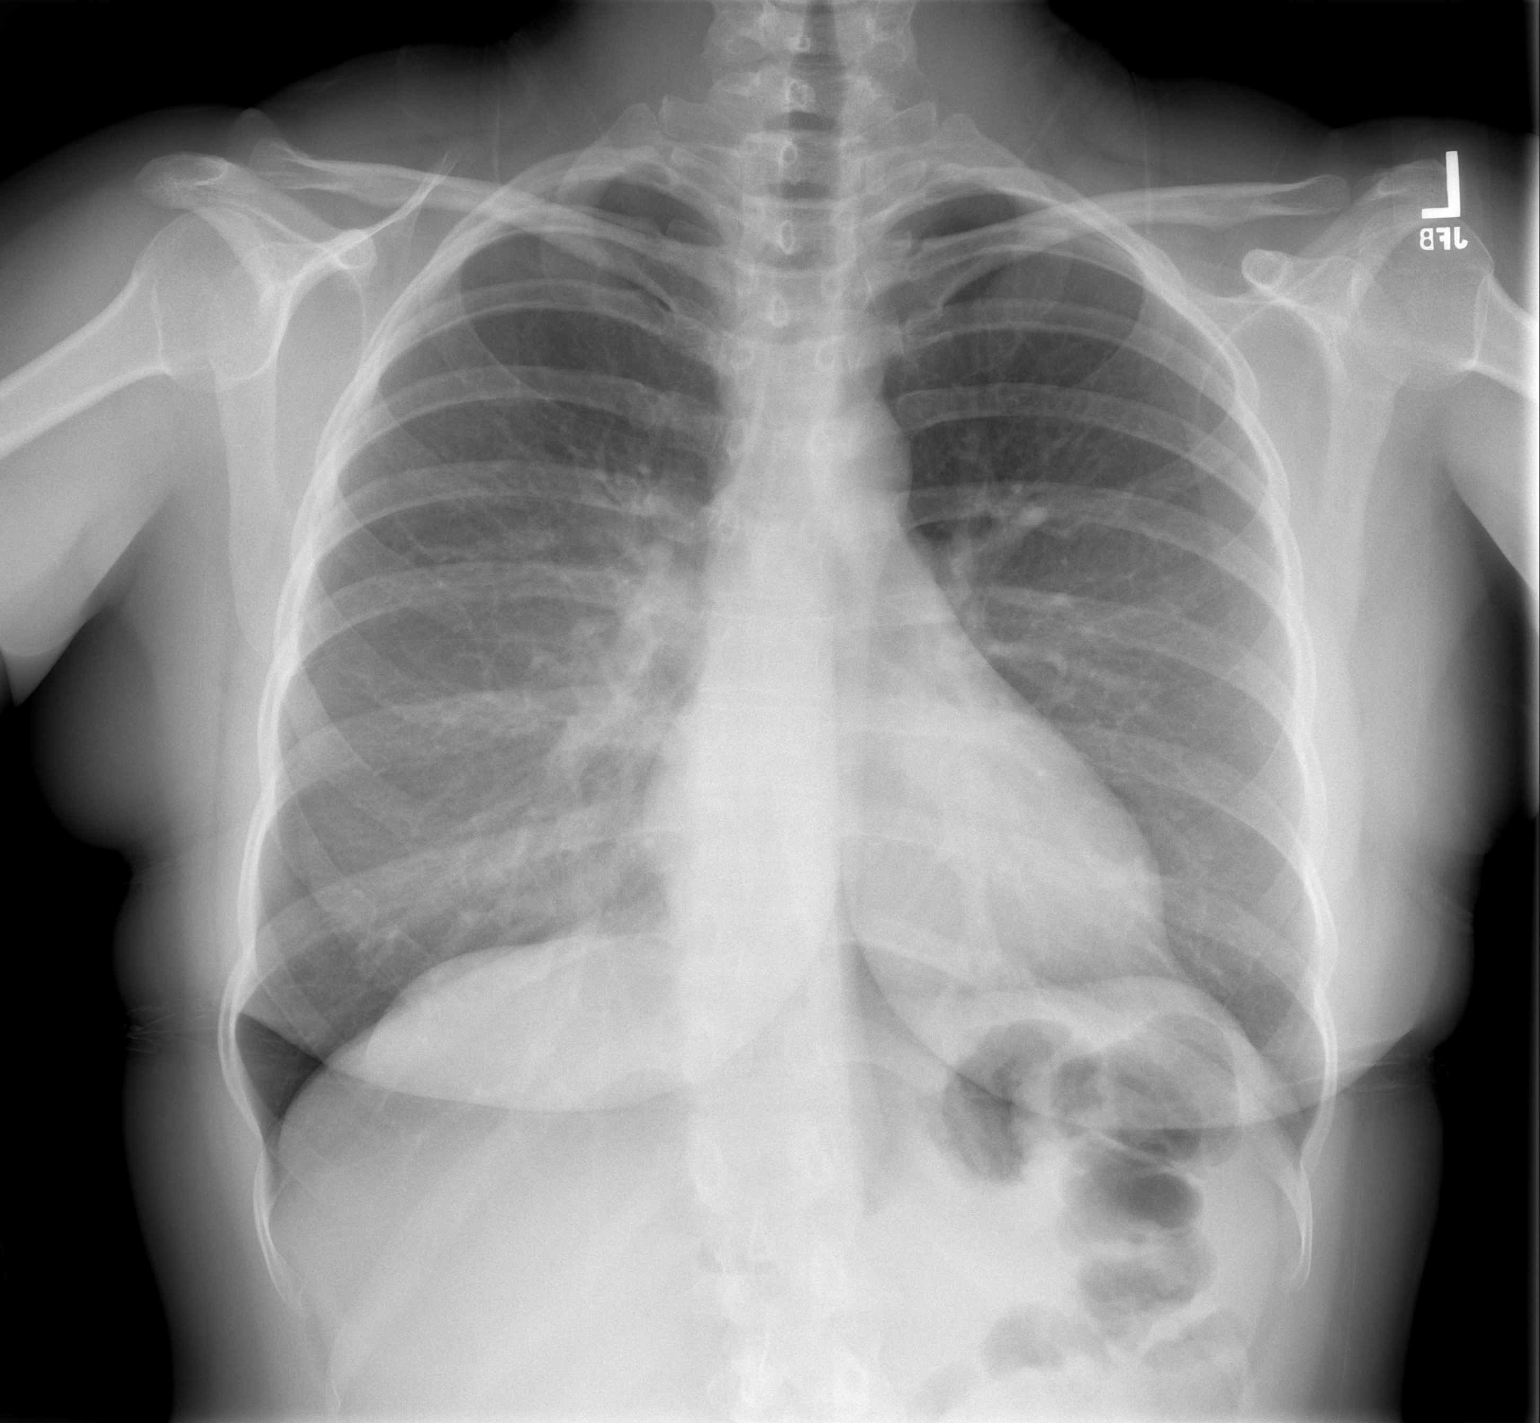

[w chest lat]
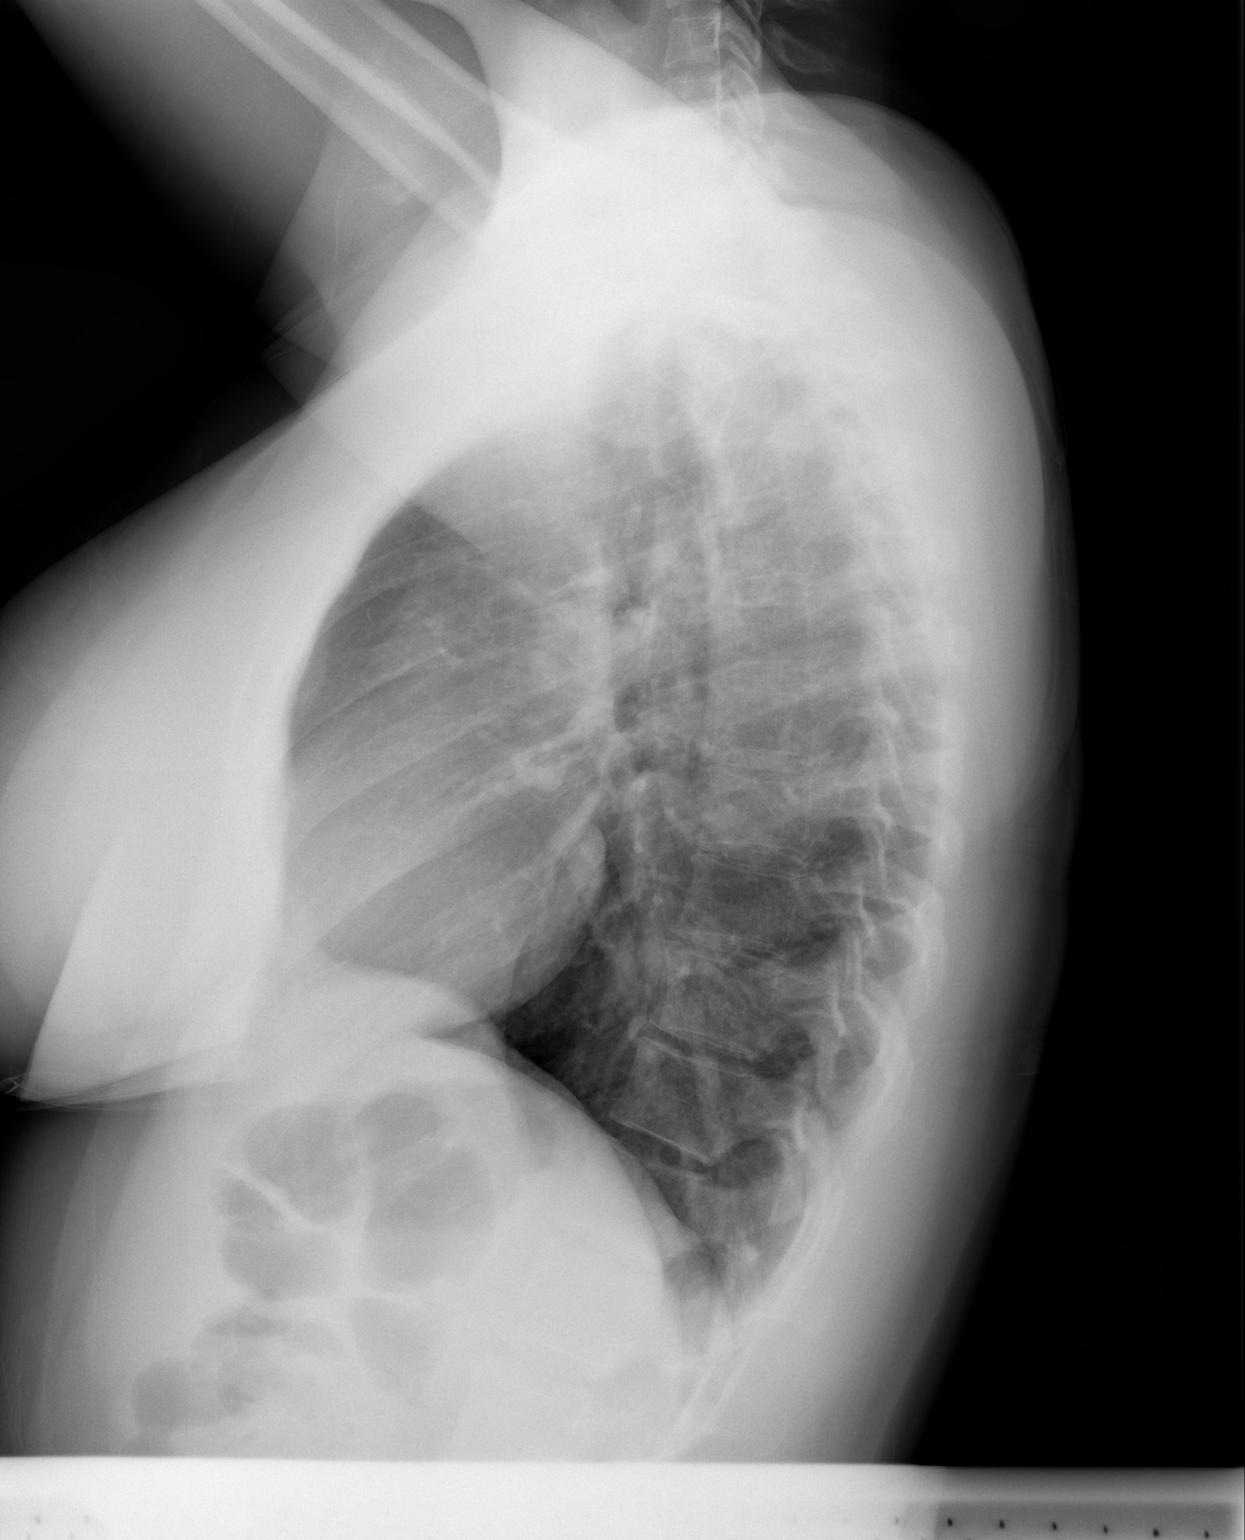

[2 of 2 positions shown; findings below may reference images not displayed]

FINDINGS: Lungs are clear. Heart size and pulmonary vascularity are normal. No
adenopathy. No bone lesions. No pneumothorax.
IMPRESSION: No edema or consolidation

## 2017-10-12 IMAGING — DX DG ABDOMEN 2V
2 series · 2 of 2 positions shown · non-contrast
Comparison: None.

CLINICAL DATA: Abdominal pain for 1 week

EXAM:
ABDOMEN - 2 VIEW

[abdomen erect]
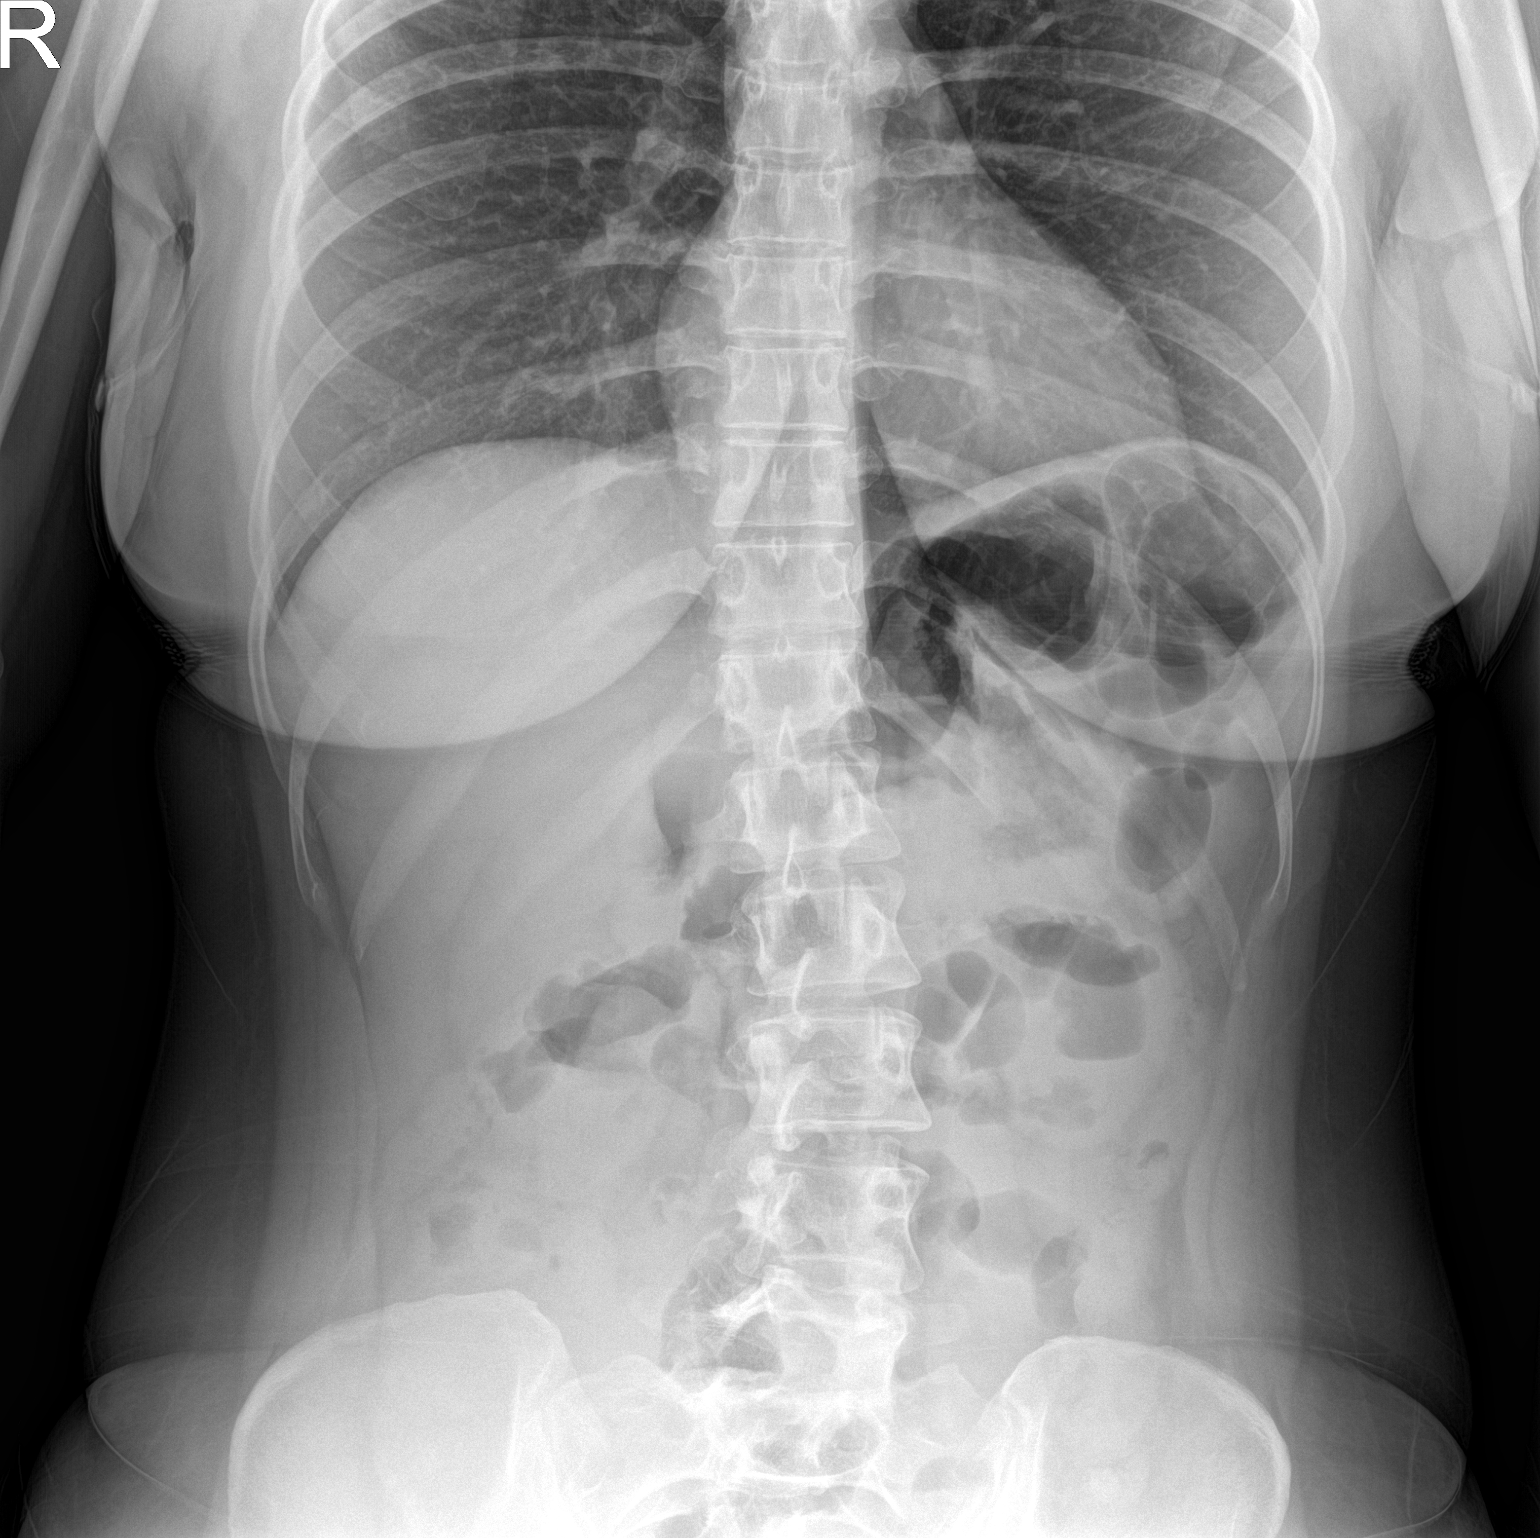

[abdomen supine]
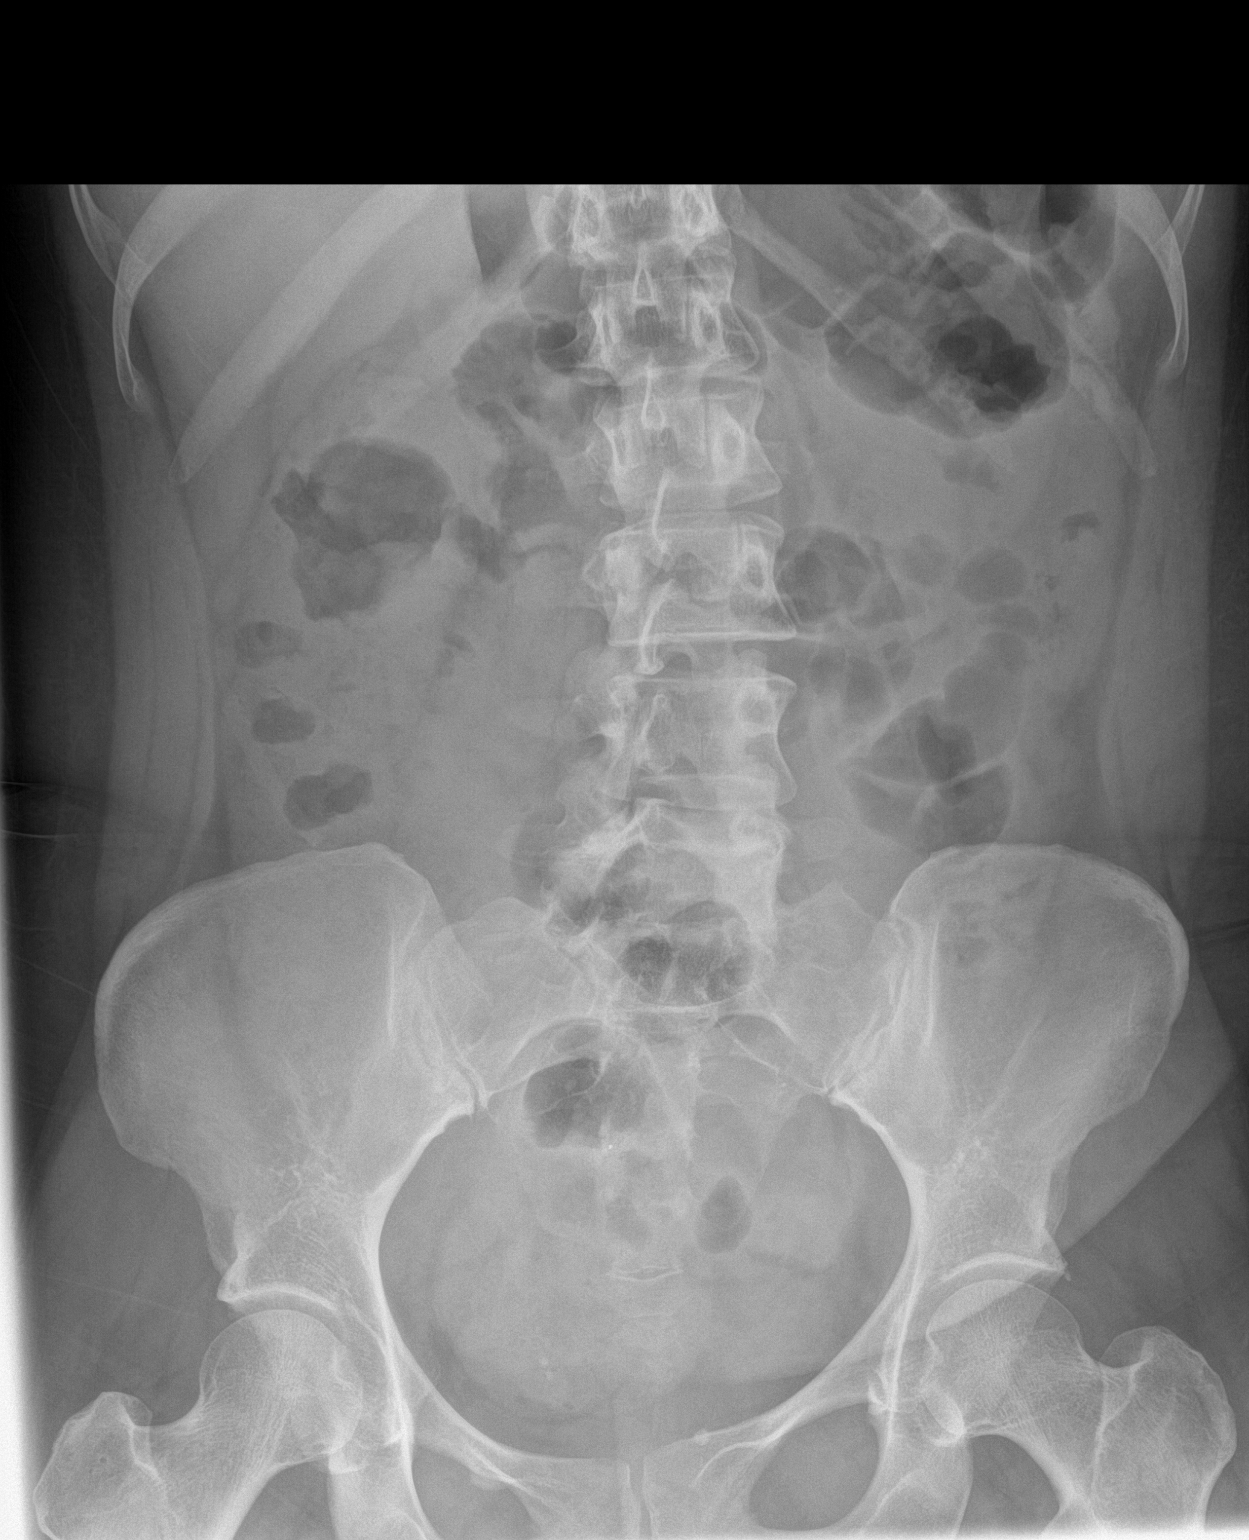

[2 of 2 positions shown; findings below may reference images not displayed]

FINDINGS: Scattered large and small bowel gas is noted. No free air is seen. A
tiny 1-2 mm left upper pole renal stone is noted. Other renal
calculi are seen. Two calcifications are noted in the pelvis likely
representing phleboliths. Correlation with the clinical history is
recommended.
IMPRESSION: Left renal stone.

Calcifications in the pelvis likely representing phleboliths.

## 2018-03-11 ENCOUNTER — Ambulatory Visit (INDEPENDENT_AMBULATORY_CARE_PROVIDER_SITE_OTHER): Payer: BLUE CROSS/BLUE SHIELD | Admitting: Physician Assistant

## 2018-03-11 ENCOUNTER — Encounter: Payer: Self-pay | Admitting: Physician Assistant

## 2018-03-11 ENCOUNTER — Other Ambulatory Visit: Payer: Self-pay

## 2018-03-11 ENCOUNTER — Telehealth: Payer: Self-pay | Admitting: Physician Assistant

## 2018-03-11 ENCOUNTER — Ambulatory Visit: Payer: BLUE CROSS/BLUE SHIELD | Admitting: Physician Assistant

## 2018-03-11 VITALS — BP 108/78 | HR 91 | Temp 98.0°F | Resp 14 | Ht 60.0 in | Wt 134.0 lb

## 2018-03-11 DIAGNOSIS — F430 Acute stress reaction: Secondary | ICD-10-CM | POA: Diagnosis not present

## 2018-03-11 MED ORDER — ALPRAZOLAM 0.25 MG PO TABS: 0.2500 mg | ORAL_TABLET | Freq: Two times a day (BID) | ORAL | 0 refills | Status: DC | PRN

## 2018-03-11 NOTE — Patient Instructions (Signed)
Please use the handout given to schedule a counseling appointment if you do not hear from the service that was supposed to contact you.   Use the Alprazolam as directed when needed for more acute anxiety.  Do not use more than as directed. We may need to consider a daily medication for more long-term use, but will see how you do over the next few weeks as current symptoms are purely situational.  Schedule a follow-up/New Patient appointment with Dr. Carmelia Roller in the next 2 weeks.  If unavailable, please come and see me.

## 2018-03-11 NOTE — Telephone Encounter (Signed)
Pt stated at her appt with Selena BattenCody today that she would like to [facility]. Please advise if ok to schedule.
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
## 2018-03-11 NOTE — Telephone Encounter (Signed)
Fine with me

## 2018-03-12 NOTE — Telephone Encounter (Signed)
OK w me.  

## 2018-03-13 NOTE — Telephone Encounter (Signed)
LVM for pt to call the office and schedule an appt with either Dr Carmelia RollerWendling or [facility] who are taking transfer patient. Ok to schedule appt.

## 2018-03-17 ENCOUNTER — Encounter: Payer: Self-pay | Admitting: Physician Assistant

## 2018-03-18 ENCOUNTER — Telehealth: Payer: Self-pay | Admitting: Physician Assistant

## 2018-03-18 MED ORDER — DIAZEPAM 2 MG PO TABS: 2.0000 mg | ORAL_TABLET | Freq: Two times a day (BID) | ORAL | 0 refills | Status: DC | PRN

## 2018-03-18 NOTE — Telephone Encounter (Signed)
Spoke with provider and he has discontinued the Xanax because it was not effective.   Explained this to the pharmacist and she stated verbal understanding and is going to fill the medication.

## 2018-03-18 NOTE — Telephone Encounter (Signed)
Copied from CRM [id number]. Topic: Quick Communication - See Telephone Encounter >> Mar 18, 2018  4:16 PM Terisa Starraylor, Brittany L wrote: CRM for notification. See Telephone encounter for: 03/18/18.  [facility] with [facility] stated that she received diazepam (VALIUM) 2 MG tablet but looked in the system and the patient received a script for ALPRAZolam Prudy Feeler(XANAX) 0.25 MG tablet [[id number]][[id number]]  DISCONTINUED on 11/13 by Marcelline MatesWilliam Martin. Jacquline, said she does not feel comfortable filling this. Please advise.

## 2018-03-18 NOTE — Addendum Note (Signed)
Addended by: Marcelline MatesMARTIN, WILLIAM C on: 03/18/2018 04:00 PM   Modules accepted: Orders

## 2018-03-24 NOTE — Telephone Encounter (Signed)
Patient wants to add on the Buspar per Mychart convo with Malva Coganody Martin and also her appointment with Carmelia RollerWendling won't be until December and she wants to know if she has enough Valium?

## 2018-03-25 MED ORDER — BUSPIRONE HCL 5 MG PO TABS: 5.0000 mg | ORAL_TABLET | Freq: Two times a day (BID) | ORAL | 1 refills | Status: DC

## 2018-03-25 NOTE — Addendum Note (Signed)
Addended by: Waldon MerlMARTIN, WILLIAM C on: 03/25/2018 09:31 AM   Modules accepted: Orders

## 2018-03-25 NOTE — Telephone Encounter (Signed)
BuSpar has been sent in to her pharmacy.  We can handle refills of Valium if needed before her appointment with Dr. Carmelia RollerWendling.

## 2018-03-25 NOTE — Telephone Encounter (Signed)
Patient is aware that medication has been sent.   She is aware that we can refill valium if needed before her appt with Dr. Carmelia RollerWendling.   She will call if she does need refills.

## 2018-04-01 MED ORDER — DIAZEPAM 2 MG PO TABS: 2.0000 mg | ORAL_TABLET | Freq: Two times a day (BID) | ORAL | 1 refills | Status: DC | PRN

## 2018-04-01 NOTE — Telephone Encounter (Signed)
Patient states that her valium was denied by Selena Battenody and she said she was told that Selena BattenCody would keep refilling her medicines until she seen Dr Carmelia RollerWendling. She has been out of the medication Please Advise.   [id number] [address] [place], [place] - [phone] [facility] [address]  [address] [place] [id number]

## 2018-04-01 NOTE — Addendum Note (Signed)
Addended by: Con MemosMOORE, PATINA S on: 04/01/2018 01:50 PM   Modules accepted: Orders

## 2018-04-01 NOTE — Addendum Note (Signed)
Addended by: Waldon MerlMARTIN, WILLIAM C on: 04/01/2018 01:51 PM   Modules accepted: Orders

## 2018-04-11 ENCOUNTER — Encounter: Payer: Self-pay | Admitting: Physician Assistant

## 2018-04-11 MED ORDER — DIAZEPAM 2 MG PO TABS: 2.0000 mg | ORAL_TABLET | Freq: Two times a day (BID) | ORAL | 0 refills | Status: DC | PRN

## 2018-04-21 ENCOUNTER — Ambulatory Visit (INDEPENDENT_AMBULATORY_CARE_PROVIDER_SITE_OTHER): Payer: BLUE CROSS/BLUE SHIELD | Admitting: Family Medicine

## 2018-04-21 ENCOUNTER — Encounter: Payer: Self-pay | Admitting: Family Medicine

## 2018-04-21 VITALS — BP 108/70 | HR 102 | Temp 98.6°F | Ht 60.0 in | Wt 130.1 lb

## 2018-04-21 DIAGNOSIS — F4323 Adjustment disorder with mixed anxiety and depressed mood: Secondary | ICD-10-CM

## 2018-04-21 DIAGNOSIS — F430 Acute stress reaction: Secondary | ICD-10-CM | POA: Diagnosis not present

## 2018-04-21 MED ORDER — VALACYCLOVIR HCL 500 MG PO TABS
500.0000 mg | ORAL_TABLET | Freq: Every day | ORAL | 3 refills | Status: DC
Start: 2018-04-21 — End: 2019-03-17

## 2018-04-21 MED ORDER — ESCITALOPRAM OXALATE 10 MG PO TABS: 10.0000 mg | ORAL_TABLET | Freq: Every day | ORAL | 1 refills | Status: DC

## 2018-04-21 MED ORDER — DIAZEPAM 5 MG PO TABS: 5.0000 mg | ORAL_TABLET | Freq: Two times a day (BID) | ORAL | 1 refills | Status: DC | PRN

## 2018-04-21 NOTE — Progress Notes (Signed)
Pre visit review using our clinic review tool, if applicable. No additional management support is needed unless otherwise documented below in the visit note. 

## 2018-04-21 NOTE — Patient Instructions (Addendum)
Please consider counseling. Contact [phone] to schedule an appointment or inquire about cost/insurance coverage.   Keep the diet clean and stay active.  Coping skills Choose 5 that work for you:  Take a deep breath  Count to 20  Read a book  Do a puzzle  Meditate  Bake  Sing  Knit  Garden  Pray  Go outside  Call a friend  Listen to music  Take a walk  Color  Send a note  Take a bath  Watch a movie  Be alone in a quiet place  Pet an animal  Visit a friend  Journal  Exercise  Stretch   Let us know if you need anything.  
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
## 2018-05-16 ENCOUNTER — Encounter: Payer: Self-pay | Admitting: Family Medicine

## 2018-05-19 ENCOUNTER — Other Ambulatory Visit: Payer: Self-pay | Admitting: Family Medicine

## 2018-05-19 MED ORDER — LORAZEPAM 1 MG PO TABS: 0.5000 mg | ORAL_TABLET | Freq: Every evening | ORAL | 0 refills | Status: DC | PRN

## 2018-06-02 ENCOUNTER — Encounter: Payer: Self-pay | Admitting: Family Medicine

## 2018-06-02 ENCOUNTER — Ambulatory Visit: Payer: BLUE CROSS/BLUE SHIELD | Admitting: Family Medicine

## 2018-06-02 VITALS — BP 106/62 | HR 98 | Temp 98.2°F | Ht 60.0 in | Wt 133.1 lb

## 2018-06-02 DIAGNOSIS — F4323 Adjustment disorder with mixed anxiety and depressed mood: Secondary | ICD-10-CM | POA: Insufficient documentation

## 2018-06-02 MED ORDER — ESCITALOPRAM OXALATE 10 MG PO TABS: 15.0000 mg | ORAL_TABLET | Freq: Every day | ORAL | 1 refills | Status: DC

## 2018-06-02 MED ORDER — LORAZEPAM 2 MG PO TABS: ORAL_TABLET | ORAL | 1 refills | Status: DC

## 2018-06-02 NOTE — Progress Notes (Signed)
Pre visit review using our clinic review tool, if applicable. No additional management support is needed unless otherwise documented below in the visit note. 
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
## 2018-06-02 NOTE — Patient Instructions (Signed)
Keep your appt with psychiatry.  [facility] [address] [place], [place] [address] [phone]  [facility] [address] [place], [place] [address] [phone]  [facility] [address] [place], [place] [address] [phone]  [facility] [address], [place], [place], #[phone] [address], [place], [place], #[phone]  [facility] [address] [place], [place] [address] [phone]  [facility] [address] [place], [place] [phone]  [facility] [address] [place], [place] [phone]  Call one of these offices sooner than later as it can take 2-3 months to get a new patient appointment.   Let us know if you need anything.

## 2018-06-11 ENCOUNTER — Other Ambulatory Visit: Payer: Self-pay | Admitting: Family Medicine

## 2018-06-11 DIAGNOSIS — F4323 Adjustment disorder with mixed anxiety and depressed mood: Secondary | ICD-10-CM

## 2018-06-13 ENCOUNTER — Other Ambulatory Visit: Payer: Self-pay | Admitting: Family Medicine

## 2018-06-13 DIAGNOSIS — F4323 Adjustment disorder with mixed anxiety and depressed mood: Secondary | ICD-10-CM

## 2018-07-06 ENCOUNTER — Other Ambulatory Visit: Payer: Self-pay | Admitting: Physician Assistant

## 2018-07-09 ENCOUNTER — Other Ambulatory Visit: Payer: Self-pay

## 2018-07-09 ENCOUNTER — Ambulatory Visit: Payer: BLUE CROSS/BLUE SHIELD | Admitting: Family Medicine

## 2018-07-09 ENCOUNTER — Encounter: Payer: Self-pay | Admitting: Family Medicine

## 2018-07-09 ENCOUNTER — Telehealth: Payer: Self-pay | Admitting: Family Medicine

## 2018-07-09 VITALS — BP 108/68 | HR 108 | Temp 98.2°F | Ht 60.0 in | Wt 135.0 lb

## 2018-07-09 DIAGNOSIS — J301 Allergic rhinitis due to pollen: Secondary | ICD-10-CM | POA: Diagnosis not present

## 2018-07-09 DIAGNOSIS — R05 Cough: Secondary | ICD-10-CM

## 2018-07-09 DIAGNOSIS — R058 Other specified cough: Secondary | ICD-10-CM

## 2018-07-09 MED ORDER — PROMETHAZINE-CODEINE 6.25-10 MG/5ML PO SOLN: 5.0000 mL | Freq: Four times a day (QID) | ORAL | 0 refills | Status: DC | PRN

## 2018-07-09 NOTE — Telephone Encounter (Signed)
Copied from CRM [id number]. Topic: Quick Communication - See Telephone Encounter >> Jul 09, 2018 12:25 PM Terisa Starr wrote: CRM for notification. See Telephone encounter for: 07/09/18.  [facility]ig Castilla with [facility] called and would like to know if Dr Carmelia Roller wants her to have a full 16 ounce bottle of the Promethazine-Codeine 6.25-10 MG/5ML SOLN - script just says " one bottle " - please advise.

## 2018-07-09 NOTE — Telephone Encounter (Signed)
118 mL should be sufficient.

## 2018-07-09 NOTE — Telephone Encounter (Signed)
Pharmacy informed of PCP instructions. 

## 2018-07-09 NOTE — Progress Notes (Signed)
Chief Complaint  Patient presents with  . Cough  . Nasal Congestion    Lisa Dyer here for URI complaints.  Duration: 2 months cough, other s/s's started yesterday Associated symptoms: sinus congestion, rhinorrhea and dry cough Denies: sinus pain, itchy watery eyes, ear pain, ear drainage, sore throat, wheezing, shortness of breath, myalgia and fevers Treatment to date: Theraflu, Dayquil Sick contacts: Yes  ROS:  Const: Denies fevers HEENT: As noted in HPI Lungs: No SOB  Past Medical History:  Diagnosis Date  . Anxiety   . Asthma   . Chicken pox   . Depression    situational depression dcd all meds  . Headache     BP 108/68 (BP Location: Left Arm, Patient Position: Sitting, Cuff Size: Normal)   Pulse (!) 108   Temp 98.2 F (36.8 C) (Oral)   Ht 5' (1.524 m)   Wt 135 lb (61.2 kg)   SpO2 98%   BMI 26.37 kg/m  General: Awake, alert, appears stated age HEENT: AT, , ears patent b/l and TM's neg, nares patent w/o discharge, pharynx pink and without exudates, MMM Neck: No masses or asymmetry Heart: RRR Lungs: CTAB, no accessory muscle use Psych: Age appropriate judgment and insight, normal mood and affect  Post-viral cough syndrome - Plan: Promethazine-Codeine 6.25-10 MG/5ML SOLN  Seasonal allergic rhinitis due to pollen  Orders as above. Continue to push fluids, practice good hand hygiene, cover mouth when coughing. Cough med sent.  Warning signs and symptoms discussed with patient regarding this medication.  Need to start treating allergies. F/u prn. If starting to experience fevers, shaking, or shortness of breath, seek immediate care. Pt voiced understanding and agreement to the plan.  Jilda Roche [place], DO 07/09/18 11:52 AM

## 2018-07-09 NOTE — Patient Instructions (Addendum)
Continue to push fluids, practice good hand hygiene, and cover your mouth if you cough.  If you start having fevers, shaking or shortness of breath, seek immediate care.  Do not drink alcohol, do any illicit/street drugs, drive or do anything that requires alertness while on this cough medicine.  I would hold off on an antibiotic for now.   Let us know if you need anything.

## 2018-07-14 ENCOUNTER — Ambulatory Visit: Payer: BLUE CROSS/BLUE SHIELD | Admitting: Family Medicine

## 2018-07-16 ENCOUNTER — Encounter: Payer: Self-pay | Admitting: Family Medicine

## 2018-07-16 ENCOUNTER — Other Ambulatory Visit: Payer: Self-pay

## 2018-07-16 ENCOUNTER — Ambulatory Visit: Payer: BLUE CROSS/BLUE SHIELD | Admitting: Family Medicine

## 2018-07-16 DIAGNOSIS — R058 Other specified cough: Secondary | ICD-10-CM

## 2018-07-16 DIAGNOSIS — R05 Cough: Secondary | ICD-10-CM | POA: Diagnosis not present

## 2018-07-16 DIAGNOSIS — F4323 Adjustment disorder with mixed anxiety and depressed mood: Secondary | ICD-10-CM | POA: Diagnosis not present

## 2018-07-16 MED ORDER — FLUTICASONE PROPIONATE HFA 110 MCG/ACT IN AERO: 2.0000 | INHALATION_SPRAY | Freq: Two times a day (BID) | RESPIRATORY_TRACT | 2 refills | Status: DC

## 2018-07-16 MED ORDER — DIAZEPAM 5 MG PO TABS: ORAL_TABLET | ORAL | 1 refills | Status: DC

## 2018-07-16 MED ORDER — BUSPIRONE HCL 10 MG PO TABS: 10.0000 mg | ORAL_TABLET | Freq: Two times a day (BID) | ORAL | 2 refills | Status: DC

## 2018-07-16 MED ORDER — LORAZEPAM 2 MG PO TABS: ORAL_TABLET | ORAL | 1 refills | Status: DC

## 2018-07-16 MED ORDER — PROMETHAZINE-CODEINE 6.25-10 MG/5ML PO SOLN: 5.0000 mL | Freq: Four times a day (QID) | ORAL | 0 refills | Status: DC | PRN

## 2018-07-16 NOTE — Progress Notes (Signed)
Chief Complaint  Patient presents with  . Anxiety  . Cough    Subjective Lisa Dyer presents for f/u anxiety/depression.  She is currently being treated with Lexapro 10 mg/d, BuSpar 5 mg bid, Ativan 1-2 mg qhs prn, Valium daily prn.  Reports good benefit since treatment. No thoughts of harming self or others. No self-medication with alcohol, prescription drugs or illicit drugs. Pt is not following with a counselor/psychologist.  She has an appointment with the psychiatry team in May.  She is on a wait list.  She is still having a cough.  The cough syrup helps but she ran out.  Breathing is okay.  Still having some upper respiratory symptoms, she is currently taking Claritin.  ROS Psych: No homicidal or suicidal thoughts Lungs: +cough  Past Medical History:  Diagnosis Date  . Anxiety   . Asthma   . Chicken pox   . Depression    situational depression dcd all meds  . Headache    Exam BP 118/62 (BP Location: Left Arm, Patient Position: Sitting, Cuff Size: Normal)   Pulse 76   Temp 98.1 F (36.7 C) (Oral)   Ht 5' (1.524 m)   Wt 135 lb 8 oz (61.5 kg)   SpO2 98%   BMI 26.46 kg/m  General:  well developed, well nourished, in no apparent distress Neck: neck supple without adenopathy, thyromegaly, or masses Lungs:  clear to auscultation, breath sounds equal bilaterally, no respiratory distress Cardio:  regular rate and rhythm without murmurs, heart sounds without clicks or rubs Psych: well oriented with normal range of affect and age-appropriate judgement/insight, alert and oriented x4.  Assessment and Plan  Situational mixed anxiety and depressive disorder - Plan: busPIRone (BUSPAR) 10 MG tablet, LORazepam (ATIVAN) 2 MG tablet, diazepam (VALIUM) 5 MG tablet  Post-viral cough syndrome - Plan: Promethazine-Codeine 6.25-10 MG/5ML SOLN, fluticasone (FLOVENT HFA) 110 MCG/ACT inhaler  Continue current psychiatric medication with the exception of increasing BuSpar from 5  mg twice daily to 10 mg twice daily.  Keep appointment with psychiatry. Cough is uncontrolled.  Refill syrup.  Go back on Allegra.  Trial inhaled corticosteroid if Allegra does not resolve issue, rinse mouth out after use. F/u in 1 month if no improvement. The patient voiced understanding and agreement to the plan.  Jilda Roche [place], DO 07/16/18 12:08 PM

## 2018-07-16 NOTE — Patient Instructions (Signed)
Go back on Allegra. If this does not help with the cough start using inhaler.  Rinse mouth out after using the inhaler.   Let us know if you need anything.

## 2018-08-06 ENCOUNTER — Other Ambulatory Visit: Payer: Self-pay | Admitting: Family Medicine

## 2018-08-06 DIAGNOSIS — F4323 Adjustment disorder with mixed anxiety and depressed mood: Secondary | ICD-10-CM

## 2018-08-14 DIAGNOSIS — F4310 Post-traumatic stress disorder, unspecified: Secondary | ICD-10-CM | POA: Diagnosis not present

## 2018-08-14 DIAGNOSIS — F410 Panic disorder [episodic paroxysmal anxiety] without agoraphobia: Secondary | ICD-10-CM | POA: Diagnosis not present

## 2018-08-21 ENCOUNTER — Other Ambulatory Visit: Payer: Self-pay | Admitting: Family Medicine

## 2018-08-21 DIAGNOSIS — R058 Other specified cough: Secondary | ICD-10-CM

## 2018-08-21 DIAGNOSIS — R05 Cough: Secondary | ICD-10-CM

## 2018-10-04 ENCOUNTER — Other Ambulatory Visit: Payer: Self-pay | Admitting: Family Medicine

## 2018-10-04 DIAGNOSIS — F4323 Adjustment disorder with mixed anxiety and depressed mood: Secondary | ICD-10-CM

## 2018-10-26 ENCOUNTER — Emergency Department (HOSPITAL_COMMUNITY)
Admission: EM | Admit: 2018-10-26 | Discharge: 2018-10-26 | Discharge status: Against Medical Advice | Payer: BLUE CROSS/BLUE SHIELD

## 2018-10-26 ENCOUNTER — Other Ambulatory Visit: Payer: Self-pay

## 2018-10-30 DIAGNOSIS — F410 Panic disorder [episodic paroxysmal anxiety] without agoraphobia: Secondary | ICD-10-CM | POA: Diagnosis not present

## 2018-10-30 DIAGNOSIS — F4310 Post-traumatic stress disorder, unspecified: Secondary | ICD-10-CM | POA: Diagnosis not present

## 2018-11-28 ENCOUNTER — Other Ambulatory Visit: Payer: Self-pay | Admitting: Family Medicine

## 2018-11-28 DIAGNOSIS — F4323 Adjustment disorder with mixed anxiety and depressed mood: Secondary | ICD-10-CM

## 2018-12-22 ENCOUNTER — Encounter: Payer: Self-pay | Admitting: Family Medicine

## 2018-12-23 MED ORDER — NORETHIN ACE-ETH ESTRAD-FE 1-20 MG-MCG(24) PO TABS: 1.0000 | ORAL_TABLET | Freq: Every day | ORAL | 11 refills | Status: DC

## 2019-01-07 ENCOUNTER — Encounter: Payer: Self-pay | Admitting: Family Medicine

## 2019-01-15 ENCOUNTER — Other Ambulatory Visit: Payer: Self-pay

## 2019-01-16 ENCOUNTER — Ambulatory Visit: Payer: BC Managed Care – PPO | Admitting: Family Medicine

## 2019-01-16 ENCOUNTER — Encounter: Payer: Self-pay | Admitting: Family Medicine

## 2019-01-16 VITALS — BP 124/80 | HR 77 | Temp 97.1°F | Ht 60.0 in | Wt 145.1 lb

## 2019-01-16 DIAGNOSIS — S46819A Strain of other muscles, fascia and tendons at shoulder and upper arm level, unspecified arm, initial encounter: Secondary | ICD-10-CM

## 2019-01-16 DIAGNOSIS — M545 Low back pain, unspecified: Secondary | ICD-10-CM

## 2019-01-16 DIAGNOSIS — G44209 Tension-type headache, unspecified, not intractable: Secondary | ICD-10-CM

## 2019-01-16 DIAGNOSIS — G8929 Other chronic pain: Secondary | ICD-10-CM | POA: Diagnosis not present

## 2019-01-16 MED ORDER — CYCLOBENZAPRINE HCL 10 MG PO TABS: 5.0000 mg | ORAL_TABLET | Freq: Three times a day (TID) | ORAL | 0 refills | Status: DC | PRN

## 2019-01-16 MED ORDER — METHYLPREDNISOLONE ACETATE 80 MG/ML IJ SUSP
80.0000 mg | Freq: Once | INTRAMUSCULAR | Status: AC
  Administered 2019-01-16: 80 mg via INTRAMUSCULAR

## 2019-01-16 MED ORDER — PREDNISONE 20 MG PO TABS: 40.0000 mg | ORAL_TABLET | Freq: Every day | ORAL | 0 refills | Status: AC
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
## 2019-01-16 NOTE — Patient Instructions (Signed)
Heat (pad or rice pillow in microwave) over affected area, 10-15 minutes twice daily.   Ice/cold pack over area for 10-15 min twice daily.  OK to take Tylenol 1000 mg (2 extra strength tabs) or 975 mg (3 regular strength tabs) every 6 hours as needed.  Trapezius stretches/exercises Do exercises exactly as told by your health care provider and adjust them as directed. It is normal to feel mild stretching, pulling, tightness, or discomfort as you do these exercises, but you should stop right away if you feel sudden pain or your pain gets worse.  Stretching and range of motion exercises These exercises warm up your muscles and joints and improve the movement and flexibility of your shoulder. These exercises can also help to relieve pain, numbness, and tingling. If you are unable to do any of the following for any reason, do not further attempt to do it.   Exercise A: Flexion, standing    1. Stand and hold a broomstick, a cane, or a similar object. Place your hands a little more than shoulder-width apart on the object. Your left / right hand should be palm-up, and your other hand should be palm-down. 2. Push the stick to raise your left / right arm out to your side and then over your head. Use your other hand to help move the stick. Stop when you feel a stretch in your shoulder, or when you reach the angle that is recommended by your health care provider. ? Avoid shrugging your shoulder while you raise your arm. Keep your shoulder blade tucked down toward your spine. 3. Hold for 30 seconds. 4. Slowly return to the starting position. Repeat 2 times. Complete this exercise 3 times per week.  Exercise B: Abduction, supine    1. Lie on your back and hold a broomstick, a cane, or a similar object. Place your hands a little more than shoulder-width apart on the object. Your left / right hand should be palm-up, and your other hand should be palm-down. 2. Push the stick to raise your left / right  arm out to your side and then over your head. Use your other hand to help move the stick. Stop when you feel a stretch in your shoulder, or when you reach the angle that is recommended by your health care provider. ? Avoid shrugging your shoulder while you raise your arm. Keep your shoulder blade tucked down toward your spine. 3. Hold for 30 seconds. 4. Slowly return to the starting position. Repeat 2 times. Complete this exercise 3 times per week.  Exercise C: Flexion, active-assisted    1. Lie on your back. You may bend your knees for comfort. 2. Hold a broomstick, a cane, or a similar object. Place your hands about shoulder-width apart on the object. Your palms should face toward your feet. 3. Raise the stick and move your arms over your head and behind your head, toward the floor. Use your healthy arm to help your left / right arm move farther. Stop when you feel a gentle stretch in your shoulder, or when you reach the angle where your health care provider tells you to stop. 4. Hold for 30 seconds. 5. Slowly return to the starting position. Repeat 2 times. Complete this exercise 3 times per week.  Exercise D: External rotation and abduction    1. Stand in a door frame with one of your feet slightly in front of the other. This is called a staggered stance. 2. Choose one of the  following positions as told by your health care provider: ? Place your hands and forearms on the door frame above your head. ? Place your hands and forearms on the door frame at the height of your head. ? Place your hands on the door frame at the height of your elbows. 3. Slowly move your weight onto your front foot until you feel a stretch across your chest and in the front of your shoulders. Keep your head and chest upright and keep your abdominal muscles tight. 4. Hold for 30 seconds. 5. To release the stretch, shift your weight to your back foot. Repeat 2 times. Complete this stretch 3 times per  week.  Strengthening exercises These exercises build strength and endurance in your shoulder. Endurance is the ability to use your muscles for a long time, even after your muscles get tired. Exercise E: Scapular depression and adduction  1. Sit on a stable chair. Support your arms in front of you with pillows, armrests, or a tabletop. Keep your elbows in line with the sides of your body. 2. Gently move your shoulder blades down toward your middle back. Relax the muscles on the tops of your shoulders and in the back of your neck. 3. Hold for 3 seconds. 4. Slowly release the tension and relax your muscles completely before doing this exercise again. Repeat for a total of 10 repetitions. 5. After you have practiced this exercise, try doing the exercise without the arm support. Then, try the exercise while standing instead of sitting. Repeat 2 times. Complete this exercise 3 times per week.  Exercise F: Shoulder abduction, isometric    1. Stand or sit about 4-6 inches (10-15 cm) from a wall with your left / right side facing the wall. 2. Bend your left / right elbow and gently press your elbow against the wall. 3. Increase the pressure slowly until you are pressing as hard as you can without shrugging your shoulder. 4. Hold for 3 seconds. 5. Slowly release the tension and relax your muscles completely. Repeat for a total of 10 repetitions. Repeat 2 times. Complete this exercise 3 times per week.  Exercise G: Shoulder flexion, isometric    1. Stand or sit about 4-6 inches (10-15 cm) away from a wall with your left / right side facing the wall. 2. Keep your left / right elbow straight and gently press the top of your fist against the wall. Increase the pressure slowly until you are pressing as hard as you can without shrugging your shoulder. 3. Hold for 10-15 seconds. 4. Slowly release the tension and relax your muscles completely. Repeat for a total of 10 repetitions. Repeat 2 times.  Complete this exercise 3 times per week.  Exercise H: Internal rotation    1. Sit in a stable chair without armrests, or stand. Secure an exercise band at your left / right side, at elbow height. 2. Place a soft object, such as a folded towel or a small pillow, under your left / right upper arm so your elbow is a few inches (about 8 cm) away from your side. 3. Hold the end of the exercise band so the band stretches. 4. Keeping your elbow pressed against the soft object under your arm, move your forearm across your body toward your abdomen. Keep your body steady so the movement is only coming from your shoulder. 5. Hold for 3 seconds. 6. Slowly return to the starting position. Repeat for a total of 10 repetitions. Repeat 2 times.  Complete this exercise 3 times per week.  Exercise I: External rotation    1. Sit in a stable chair without armrests, or stand. 2. Secure an exercise band at your left / right side, at elbow height. 3. Place a soft object, such as a folded towel or a small pillow, under your left / right upper arm so your elbow is a few inches (about 8 cm) away from your side. 4. Hold the end of the exercise band so the band stretches. 5. Keeping your elbow pressed against the soft object under your arm, move your forearm out, away from your abdomen. Keep your body steady so the movement is only coming from your shoulder. 6. Hold for 3 seconds. 7. Slowly return to the starting position. Repeat for a total of 10 repetitions. Repeat 2 times. Complete this exercise 3 times per week. Exercise J: Shoulder extension  1. Sit in a stable chair without armrests, or stand. Secure an exercise band to a stable object in front of you so the band is at shoulder height. 2. Hold one end of the exercise band in each hand. Your palms should face each other. 3. Straighten your elbows and lift your hands up to shoulder height. 4. Step back, away from the secured end of the exercise band, until the  band stretches. 5. Squeeze your shoulder blades together and pull your hands down to the sides of your thighs. Stop when your hands are straight down by your sides. Do not let your hands go behind your body. 6. Hold for 3 seconds. 7. Slowly return to the starting position. Repeat for a total of 10 repetitions. Repeat 2 times. Complete this exercise 3 times per week.  Exercise K: Shoulder extension, prone    1. Lie on your abdomen on a firm surface so your left / right arm hangs over the edge. 2. Hold a 5 lb weight in your hand so your palm faces in toward your body. Your arm should be straight. 3. Squeeze your shoulder blade down toward the middle of your back. 4. Slowly raise your arm behind you, up to the height of the surface that you are lying on. Keep your arm straight. 5. Hold for 3 seconds. 6. Slowly return to the starting position and relax your muscles. Repeat for a total of 10 repetitions. Repeat 2 times. Complete this exercise 3 times per week.   Exercise L: Horizontal abduction, prone  1. Lie on your abdomen on a firm surface so your left / right arm hangs over the edge. 2. Hold a 5 lb weight in your hand so your palm faces toward your feet. Your arm should be straight. 3. Squeeze your shoulder blade down toward the middle of your back. 4. Bend your elbow so your hand moves up, until your elbow is bent to an "L" shape (90 degrees). With your elbow bent, slowly move your forearm forward and up. Raise your hand up to the height of the surface that you are lying on. ? Your upper arm should not move, and your elbow should stay bent. ? At the top of the movement, your palm should face the floor. 5. Hold for 3 seconds. 6. Slowly return to the starting position and relax your muscles. Repeat for a total of 10 repetitions. Repeat 2 times. Complete this exercise 3 times per week.  Exercise M: Horizontal abduction, standing  1. Sit on a stable chair, or stand. 2. Secure an exercise  band to a stable  object in front of you so the band is at shoulder height. 3. Hold one end of the exercise band in each hand. 4. Straighten your elbows and lift your hands straight in front of you, up to shoulder height. Your palms should face down, toward the floor. 5. Step back, away from the secured end of the exercise band, until the band stretches. 6. Move your arms out to your sides, and keep your arms straight. 7. Hold for 3 seconds. 8. Slowly return to the starting position. Repeat for a total of 10 repetitions. Repeat 2 times. Complete this exercise 3 times per week.  Exercise N: Scapular retraction and elevation  1. Sit on a stable chair, or stand. 2. Secure an exercise band to a stable object in front of you so the band is at shoulder height. 3. Hold one end of the exercise band in each hand. Your palms should face each other. 4. Sit in a stable chair without armrests, or stand. 5. Step back, away from the secured end of the exercise band, until the band stretches. 6. Squeeze your shoulder blades together and lift your hands over your head. Keep your elbows straight. 7. Hold for 3 seconds. 8. Slowly return to the starting position. Repeat for a total of 10 repetitions. Repeat 2 times. Complete this exercise 3 times per week.  This information is not intended to replace advice given to you by your health care provider. Make sure you discuss any questions you have with your health care provider. Document Released: 04/16/2005 Document Revised: 12/22/2015 Document Reviewed: 03/03/2015 [employer] Interactive Patient Education  2017 [employer]  EXERCISES  RANGE OF MOTION (ROM) AND STRETCHING EXERCISES - Low Back Pain Most people with lower back pain will find that their symptoms get worse with excessive bending forward (flexion) or arching at the lower back (extension). The exercises that will help resolve your symptoms will focus on the opposite motion.  If you have pain, numbness  or tingling which travels down into your buttocks, leg or foot, the goal of the therapy is for these symptoms to move closer to your back and eventually resolve. Sometimes, these leg symptoms will get better, but your lower back pain may worsen. This is often an indication of progress in your rehabilitation. Be very alert to any changes in your symptoms and the activities in which you participated in the 24 hours prior to the change. Sharing this information with your caregiver will allow him or her to most efficiently treat your condition. These exercises may help you when beginning to rehabilitate your injury. Your symptoms may resolve with or without further involvement from your physician, physical therapist or athletic trainer. While completing these exercises, remember:   Restoring tissue flexibility helps normal motion to return to the joints. This allows healthier, less painful movement and activity.  An effective stretch should be held for at least 30 seconds.  A stretch should never be painful. You should only feel a gentle lengthening or release in the stretched tissue. FLEXION RANGE OF MOTION AND STRETCHING EXERCISES:  STRETCH - Flexion, Single Knee to Chest   Lie on a firm bed or floor with both legs extended in front of you.  Keeping one leg in contact with the floor, bring your opposite knee to your chest. Hold your leg in place by either grabbing behind your thigh or at your knee.  Pull until you feel a gentle stretch in your low back. Hold 30 seconds.  Slowly release your  grasp and repeat the exercise with the opposite side. Repeat 2 times. Complete this exercise 3 times per week.   STRETCH - Flexion, Double Knee to Chest  Lie on a firm bed or floor with both legs extended in front of you.  Keeping one leg in contact with the floor, bring your opposite knee to your chest.  Tense your stomach muscles to support your back and then lift your other knee to your chest. Hold  your legs in place by either grabbing behind your thighs or at your knees.  Pull both knees toward your chest until you feel a gentle stretch in your low back. Hold 30 seconds.  Tense your stomach muscles and slowly return one leg at a time to the floor. Repeat 2 times. Complete this exercise 3 times per week.   STRETCH - Low Trunk Rotation  Lie on a firm bed or floor. Keeping your legs in front of you, bend your knees so they are both pointed toward the ceiling and your feet are flat on the floor.  Extend your arms out to the side. This will stabilize your upper body by keeping your shoulders in contact with the floor.  Gently and slowly drop both knees together to one side until you feel a gentle stretch in your low back. Hold for 30 seconds.  Tense your stomach muscles to support your lower back as you bring your knees back to the starting position. Repeat the exercise to the other side. Repeat 2 times. Complete this exercise at least 3 times per week.   EXTENSION RANGE OF MOTION AND FLEXIBILITY EXERCISES:  STRETCH - Extension, Prone on Elbows   Lie on your stomach on the floor, a bed will be too soft. Place your palms about shoulder width apart and at the height of your head.  Place your elbows under your shoulders. If this is too painful, stack pillows under your chest.  Allow your body to relax so that your hips drop lower and make contact more completely with the floor.  Hold this position for 30 seconds.  Slowly return to lying flat on the floor. Repeat 2 times. Complete this exercise 3 times per week.   RANGE OF MOTION - Extension, Prone Press Ups  Lie on your stomach on the floor, a bed will be too soft. Place your palms about shoulder width apart and at the height of your head.  Keeping your back as relaxed as possible, slowly straighten your elbows while keeping your hips on the floor. You may adjust the placement of your hands to maximize your comfort. As you gain  motion, your hands will come more underneath your shoulders.  Hold this position 30 seconds.  Slowly return to lying flat on the floor. Repeat 2 times. Complete this exercise 3 times per week.   RANGE OF MOTION- Quadruped, Neutral Spine   Assume a hands and knees position on a firm surface. Keep your hands under your shoulders and your knees under your hips. You may place padding under your knees for comfort.  Drop your head and point your tailbone toward the ground below you. This will round out your lower back like an angry cat. Hold this position for 30 seconds.  Slowly lift your head and release your tail bone so that your back sags into a large arch, like an old horse.  Hold this position for 30 seconds.  Repeat this until you feel limber in your low back.  Now, find your "sweet  spot." This will be the most comfortable position somewhere between the two previous positions. This is your neutral spine. Once you have found this position, tense your stomach muscles to support your low back.  Hold this position for 30 seconds. Repeat 2 times. Complete this exercise 3 times per week.   STRENGTHENING EXERCISES - Low Back Sprain These exercises may help you when beginning to rehabilitate your injury. These exercises should be done near your "sweet spot." This is the neutral, low-back arch, somewhere between fully rounded and fully arched, that is your least painful position. When performed in this safe range of motion, these exercises can be used for people who have either a flexion or extension based injury. These exercises may resolve your symptoms with or without further involvement from your physician, physical therapist or athletic trainer. While completing these exercises, remember:   Muscles can gain both the endurance and the strength needed for everyday activities through controlled exercises.  Complete these exercises as instructed by your physician, physical therapist or athletic  trainer. Increase the resistance and repetitions only as guided.  You may experience muscle soreness or fatigue, but the pain or discomfort you are trying to eliminate should never worsen during these exercises. If this pain does worsen, stop and make certain you are following the directions exactly. If the pain is still present after adjustments, discontinue the exercise until you can discuss the trouble with your caregiver.  STRENGTHENING - Deep Abdominals, Pelvic Tilt   Lie on a firm bed or floor. Keeping your legs in front of you, bend your knees so they are both pointed toward the ceiling and your feet are flat on the floor.  Tense your lower abdominal muscles to press your low back into the floor. This motion will rotate your pelvis so that your tail bone is scooping upwards rather than pointing at your feet or into the floor. With a gentle tension and even breathing, hold this position for 3 seconds. Repeat 2 times. Complete this exercise 3 times per week.   STRENGTHENING - Abdominals, Crunches   Lie on a firm bed or floor. Keeping your legs in front of you, bend your knees so they are both pointed toward the ceiling and your feet are flat on the floor. Cross your arms over your chest.  Slightly tip your chin down without bending your neck.  Tense your abdominals and slowly lift your trunk high enough to just clear your shoulder blades. Lifting higher can put excessive stress on the lower back and does not further strengthen your abdominal muscles.  Control your return to the starting position. Repeat 2 times. Complete this exercise 3 times per week.   STRENGTHENING - Quadruped, Opposite UE/LE Lift   Assume a hands and knees position on a firm surface. Keep your hands under your shoulders and your knees under your hips. You may place padding under your knees for comfort.  Find your neutral spine and gently tense your abdominal muscles so that you can maintain this position. Your  shoulders and hips should form a rectangle that is parallel with the floor and is not twisted.  Keeping your trunk steady, lift your right hand no higher than your shoulder and then your left leg no higher than your hip. Make sure you are not holding your breath. Hold this position for 30 seconds.  Continuing to keep your abdominal muscles tense and your back steady, slowly return to your starting position. Repeat with the opposite arm and leg.  Repeat 2 times. Complete this exercise 3 times per week.   STRENGTHENING - Abdominals and Quadriceps, Straight Leg Raise   Lie on a firm bed or floor with both legs extended in front of you.  Keeping one leg in contact with the floor, bend the other knee so that your foot can rest flat on the floor.  Find your neutral spine, and tense your abdominal muscles to maintain your spinal position throughout the exercise.  Slowly lift your straight leg off the floor about 6 inches for a count of 3, making sure to not hold your breath.  Still keeping your neutral spine, slowly lower your leg all the way to the floor. Repeat this exercise with each leg 2 times. Complete this exercise 3 times per week.  POSTURE AND BODY MECHANICS CONSIDERATIONS - Low Back Sprain Keeping correct posture when sitting, standing or completing your activities will reduce the stress put on different body tissues, allowing injured tissues a chance to heal and limiting painful experiences. The following are general guidelines for improved posture.  While reading these guidelines, remember:  The exercises prescribed by your provider will help you have the flexibility and strength to maintain correct postures.  The correct posture provides the best environment for your joints to work. All of your joints have less wear and tear when properly supported by a spine with good posture. This means you will experience a healthier, less painful body.  Correct posture must be practiced with all  of your activities, especially prolonged sitting and standing. Correct posture is as important when doing repetitive low-stress activities (typing) as it is when doing a single heavy-load activity (lifting).  RESTING POSITIONS Consider which positions are most painful for you when choosing a resting position. If you have pain with flexion-based activities (sitting, bending, stooping, squatting), choose a position that allows you to rest in a less flexed posture. You would want to avoid curling into a fetal position on your side. If your pain worsens with extension-based activities (prolonged standing, working overhead), avoid resting in an extended position such as sleeping on your stomach. Most people will find more comfort when they rest with their spine in a more neutral position, neither too rounded nor too arched. Lying on a non-sagging bed on your side with a pillow between your knees, or on your back with a pillow under your knees will often provide some relief. Keep in mind, being in any one position for a prolonged period of time, no matter how correct your posture, can still lead to stiffness.  PROPER SITTING POSTURE In order to minimize stress and discomfort on your spine, you must sit with correct posture. Sitting with good posture should be effortless for a healthy body. Returning to good posture is a gradual process. Many people can work toward this most comfortably by using various supports until they have the flexibility and strength to maintain this posture on their own. When sitting with proper posture, your ears will fall over your shoulders and your shoulders will fall over your hips. You should use the back of the chair to support your upper back. Your lower back will be in a neutral position, just slightly arched. You may place a small pillow or folded towel at the base of your lower back for  support.  When working at a desk, create an environment that supports good, upright posture.  Without extra support, muscles tire, which leads to excessive strain on joints and other tissues. Keep these recommendations  in mind:  CHAIR:  A chair should be able to slide under your desk when your back makes contact with the back of the chair. This allows you to work closely.  The chair's height should allow your eyes to be level with the upper part of your monitor and your hands to be slightly lower than your elbows.  BODY POSITION  Your feet should make contact with the floor. If this is not possible, use a foot rest.  Keep your ears over your shoulders. This will reduce stress on your neck and low back.  INCORRECT SITTING POSTURES  If you are feeling tired and unable to assume a healthy sitting posture, do not slouch or slump. This puts excessive strain on your back tissues, causing more damage and pain. Healthier options include:  Using more support, like a lumbar pillow.  Switching tasks to something that requires you to be upright or walking.  Talking a brief walk.  Lying down to rest in a neutral-spine position.  PROLONGED STANDING WHILE SLIGHTLY LEANING FORWARD  When completing a task that requires you to lean forward while standing in one place for a long time, place either foot up on a stationary 2-4 inch high object to help maintain the best posture. When both feet are on the ground, the lower back tends to lose its slight inward curve. If this curve flattens (or becomes too large), then the back and your other joints will experience too much stress, tire more quickly, and can cause pain.  CORRECT STANDING POSTURES Proper standing posture should be assumed with all daily activities, even if they only take a few moments, like when brushing your teeth. As in sitting, your ears should fall over your shoulders and your shoulders should fall over your hips. You should keep a slight tension in your abdominal muscles to brace your spine. Your tailbone should point down to the  ground, not behind your body, resulting in an over-extended swayback posture.   INCORRECT STANDING POSTURES  Common incorrect standing postures include a forward head, locked knees and/or an excessive swayback. WALKING Walk with an upright posture. Your ears, shoulders and hips should all line-up.  PROLONGED ACTIVITY IN A FLEXED POSITION When completing a task that requires you to bend forward at your waist or lean over a low surface, try to find a way to stabilize 3 out of 4 of your limbs. You can place a hand or elbow on your thigh or rest a knee on the surface you are reaching across. This will provide you more stability, so that your muscles do not tire as quickly. By keeping your knees relaxed, or slightly bent, you will also reduce stress across your lower back. CORRECT LIFTING TECHNIQUES  DO :  Assume a wide stance. This will provide you more stability and the opportunity to get as close as possible to the object which you are lifting.  Tense your abdominals to brace your spine. Bend at the knees and hips. Keeping your back locked in a neutral-spine position, lift using your leg muscles. Lift with your legs, keeping your back straight.  Test the weight of unknown objects before attempting to lift them.  Try to keep your elbows locked down at your sides in order get the best strength from your shoulders when carrying an object.     Always ask for help when lifting heavy or awkward objects. INCORRECT LIFTING TECHNIQUES DO NOT:   Lock your knees when lifting, even if it is a  small object.  Bend and twist. Pivot at your feet or move your feet when needing to change directions.  Assume that you can safely pick up even a paperclip without proper posture.

## 2019-01-23 ENCOUNTER — Encounter: Payer: Self-pay | Admitting: Family Medicine

## 2019-03-17 ENCOUNTER — Encounter: Payer: Self-pay | Admitting: Family Medicine

## 2019-03-17 MED ORDER — VALACYCLOVIR HCL 500 MG PO TABS: 500.0000 mg | ORAL_TABLET | Freq: Two times a day (BID) | ORAL | 2 refills | Status: DC

## 2019-04-28 DIAGNOSIS — F4310 Post-traumatic stress disorder, unspecified: Secondary | ICD-10-CM | POA: Diagnosis not present

## 2019-05-18 ENCOUNTER — Encounter: Payer: Self-pay | Admitting: Family Medicine

## 2019-05-18 ENCOUNTER — Other Ambulatory Visit: Payer: Self-pay

## 2019-05-18 ENCOUNTER — Ambulatory Visit (INDEPENDENT_AMBULATORY_CARE_PROVIDER_SITE_OTHER): Payer: Self-pay | Admitting: Family Medicine

## 2019-05-18 DIAGNOSIS — F1320 Sedative, hypnotic or anxiolytic dependence, uncomplicated: Secondary | ICD-10-CM

## 2019-05-18 DIAGNOSIS — F4323 Adjustment disorder with mixed anxiety and depressed mood: Secondary | ICD-10-CM

## 2019-05-18 MED ORDER — LORAZEPAM 2 MG PO TABS: ORAL_TABLET | ORAL | 2 refills | Status: DC

## 2019-05-18 NOTE — Progress Notes (Signed)
Chief Complaint  Patient presents with  . Medication Problem    Currently taking 6 mg Ativan daily, wants to come off of this medication    Subjective: Patient is a 34 y.o. female here for anxiety. Due to COVID-19 pandemic, we are interacting via web portal for an electronic face-to-face visit. I verified patient's ID using 2 identifiers. Patient agreed to proceed with visit via this method. Patient is at home, I am at office. Patient, her husband Harriett Sine, and I are present for visit.   Pt has been seeing psychiatry who has her on Ativan 2 mg tid. She has taken doses up to 6 mg at once. It has come to the point where if she misses a dose, she has nausea and vomiting. She has not been compliant with her Lexapro and BuSpar, but plans to take it again. She does not plan on going back to her psych at this point. She used Neurontin as a fill in when she ran out of her rx prior to refill. This helped slightly, but she still felt miserable. No illicit drug use, tremors or seizures. No SI or HI.   ROS: Psych: +anxiety Neuro: No tremors  Past Medical History:  Diagnosis Date  . Anxiety   . Asthma   . Chicken pox   . Depression    situational depression dcd all meds  . Headache     Objective: No conversational dyspnea Age appropriate judgment and insight Nml affect and mood  Assessment and Plan: Situational mixed anxiety and depressive disorder - Plan: LORazepam (ATIVAN) 2 MG tablet  Benzodiazepine dependence (HCC)  Start back on BuSpar and Lexapro. Counseling and exercise rec'd. Friend and spouse will manage medication. Do not take more than 2 mg at a time. Will steadily decrease Ativan: Week 1: 2 mg in AM, 1 mg in afternoon, 2 mg in PM Week 2: 2 mg in AM, 2 mg in afternoon, 1 mg in PM Week 3: 1 mg 3 times daily Week 4: 1 mg twice daily F/u in 4 weeks.  The patient and her spouse voiced understanding and agreement to the plan.  Jilda Roche [place], DO 05/18/19  4:34  PM

## 2019-05-19 ENCOUNTER — Telehealth: Payer: Self-pay | Admitting: Family Medicine

## 2019-05-19 NOTE — Telephone Encounter (Signed)
PCP completed FMLA to [employer]. Faxed///received fax confirmation. Sent original to scan. Patient notified done.

## 2019-05-21 ENCOUNTER — Other Ambulatory Visit: Payer: Self-pay | Admitting: Family Medicine

## 2019-05-21 MED ORDER — PROMETHAZINE HCL 25 MG PO TABS: 25.0000 mg | ORAL_TABLET | Freq: Four times a day (QID) | ORAL | 0 refills | Status: DC | PRN

## 2019-05-25 NOTE — Telephone Encounter (Signed)
Received paperwork with needing more information. Have called the patient this morning/05/25/19 to inquire of problem?

## 2019-05-28 ENCOUNTER — Other Ambulatory Visit: Payer: Self-pay | Admitting: Family Medicine

## 2019-05-28 MED ORDER — PROMETHAZINE HCL 25 MG PO TABS: 25.0000 mg | ORAL_TABLET | Freq: Four times a day (QID) | ORAL | 0 refills | Status: DC | PRN

## 2019-06-06 ENCOUNTER — Other Ambulatory Visit: Payer: Self-pay | Admitting: Family Medicine

## 2019-06-08 ENCOUNTER — Other Ambulatory Visit: Payer: Self-pay | Admitting: Family Medicine

## 2019-06-08 ENCOUNTER — Encounter: Payer: Self-pay | Admitting: Family Medicine

## 2019-06-16 ENCOUNTER — Other Ambulatory Visit: Payer: Self-pay | Admitting: Family Medicine

## 2019-06-24 ENCOUNTER — Other Ambulatory Visit: Payer: Self-pay | Admitting: Family Medicine

## 2019-06-25 ENCOUNTER — Telehealth: Payer: Self-pay | Admitting: Family Medicine

## 2019-06-25 NOTE — Telephone Encounter (Signed)
Tried to call pt and schedule an appt. , but no answer.

## 2019-06-25 NOTE — Telephone Encounter (Signed)
Patient is requesting that Dr Carmelia Roller write a note for her to return back to work. Patient wanted to set an appointment with Dr. Carmelia Roller as well asap. Patient advised that first available will be next week when Provider returns to office. Patient is expected to return to work on Monday 06/29/2019. Patient has been out of work due to medical condition, only working two days a week

## 2019-06-30 ENCOUNTER — Other Ambulatory Visit: Payer: Self-pay | Admitting: Family Medicine

## 2019-07-01 ENCOUNTER — Other Ambulatory Visit: Payer: Self-pay

## 2019-07-01 ENCOUNTER — Ambulatory Visit (INDEPENDENT_AMBULATORY_CARE_PROVIDER_SITE_OTHER): Payer: Self-pay | Admitting: Family Medicine

## 2019-07-01 DIAGNOSIS — Z91199 Patient's noncompliance with other medical treatment and regimen due to unspecified reason: Secondary | ICD-10-CM

## 2019-07-01 DIAGNOSIS — Z5329 Procedure and treatment not carried out because of patient's decision for other reasons: Secondary | ICD-10-CM

## 2019-07-01 NOTE — Progress Notes (Signed)
Several attempts were made in vain to reach patient.

## 2019-07-08 ENCOUNTER — Encounter: Payer: Self-pay | Admitting: Family Medicine

## 2019-07-08 ENCOUNTER — Other Ambulatory Visit: Payer: Self-pay

## 2019-07-08 ENCOUNTER — Ambulatory Visit (INDEPENDENT_AMBULATORY_CARE_PROVIDER_SITE_OTHER): Payer: Self-pay | Admitting: Family Medicine

## 2019-07-08 DIAGNOSIS — F13939 Sedative, hypnotic or anxiolytic use, unspecified with withdrawal, unspecified: Secondary | ICD-10-CM

## 2019-07-08 DIAGNOSIS — F13239 Sedative, hypnotic or anxiolytic dependence with withdrawal, unspecified: Secondary | ICD-10-CM

## 2019-07-08 DIAGNOSIS — R110 Nausea: Secondary | ICD-10-CM

## 2019-07-08 DIAGNOSIS — L709 Acne, unspecified: Secondary | ICD-10-CM

## 2019-07-08 MED ORDER — CLINDAMYCIN PHOSPHATE 1 % EX GEL: Freq: Two times a day (BID) | CUTANEOUS | 0 refills | Status: DC

## 2019-07-08 MED ORDER — BENZOYL PEROXIDE WASH 5 % EX LIQD: Freq: Two times a day (BID) | CUTANEOUS | 12 refills | Status: AC

## 2019-07-08 MED ORDER — PROMETHAZINE HCL 25 MG PO TABS: 25.0000 mg | ORAL_TABLET | Freq: Four times a day (QID) | ORAL | 0 refills | Status: DC | PRN

## 2019-07-08 MED ORDER — GABAPENTIN 300 MG PO CAPS: 600.0000 mg | ORAL_CAPSULE | Freq: Four times a day (QID) | ORAL | 0 refills | Status: DC

## 2019-07-08 NOTE — Progress Notes (Signed)
Chief Complaint  Patient presents with  . Follow-up    going off medications    Subjective: Patient is a 34 y.o. female here for f/u. Due to COVID-19 pandemic, we are interacting via web portal for an electronic face-to-face visit. I verified patient's ID using 2 identifiers. Patient agreed to proceed with visit via this method. Patient is at home, I am at office. Patient, her sister and I are present for visit.   Pt has been in process of weaning down from Ativan. She was taking large quantities and is currently down to 0.5-1 mg daily. Everytime she gets lower, she gets N/V, sweating, and feeling awful. Neurontin helps with these symptoms. She does not get drowsy with these symptoms any longer.   She has broken out on her face. Uses generic cleansers, not benzoyl peroxide. No scarring. No new topicals or makeups.   ROS: Heart: Denies chest pain  Lungs: Denies SOB   Past Medical History:  Diagnosis Date  . Anxiety   . Asthma   . Chicken pox   . Depression    situational depression dcd all meds  . Headache     Objective: No conversational dyspnea Age appropriate judgment and insight Nml affect and mood Appears to have acne on cheeks and chin, I did not appreciate scarring, though difficult to get clear picture through web visit  Assessment and Plan: Benzodiazepine withdrawal, with unspecified complication (HCC) - Plan: gabapentin (NEURONTIN) 300 MG capsule  Nausea - Plan: promethazine (PHENERGAN) 25 MG tablet  Acne, unspecified acne type - Plan: benzoyl peroxide (BENZOYL PEROXIDE) 5 % external liquid, clindamycin (CLINDAGEL) 1 % gel  1/2- Neurontin +0.5 mg Ativan daily for next 3 dy. Then just Neurontin and will slowly wean down from this. Phenergan prn nausea. 3- BP for cleanser. If no improvement in 2 weeks, will add Clindagel. F/u in 4 weeks to reck.  The patient voiced understanding and agreement to the plan.  Jilda Roche [place], DO 07/08/19  7:31 PM

## 2019-07-16 ENCOUNTER — Other Ambulatory Visit: Payer: Self-pay | Admitting: Family Medicine

## 2019-07-16 DIAGNOSIS — R110 Nausea: Secondary | ICD-10-CM

## 2019-07-17 ENCOUNTER — Telehealth: Payer: Self-pay | Admitting: Family Medicine

## 2019-07-17 ENCOUNTER — Other Ambulatory Visit: Payer: Self-pay | Admitting: Family Medicine

## 2019-07-17 DIAGNOSIS — R110 Nausea: Secondary | ICD-10-CM

## 2019-07-17 NOTE — Telephone Encounter (Signed)
PCP received [employer] paperwork. Paper work completed/faxed /// received fax confirmation. Original sent to scan

## 2019-07-27 ENCOUNTER — Other Ambulatory Visit: Payer: Self-pay | Admitting: Family Medicine

## 2019-07-27 DIAGNOSIS — R110 Nausea: Secondary | ICD-10-CM

## 2019-07-30 ENCOUNTER — Ambulatory Visit: Payer: Self-pay | Attending: Internal Medicine

## 2019-07-30 ENCOUNTER — Telehealth: Payer: Self-pay

## 2019-07-30 DIAGNOSIS — Z23 Encounter for immunization: Secondary | ICD-10-CM

## 2019-07-30 NOTE — Telephone Encounter (Signed)
[name] from [employer] that handles the patients ADA paper work called in to confirm if Dr. Carmelia Roller has rec the patients paper work. Please give the company [employer] a call back at [phone]  Claim # [id number]

## 2019-07-30 NOTE — Progress Notes (Signed)
   Covid-19 Vaccination Clinic  Name:  [name]    MRN: [id number] DOB: [birth year]  07/30/2019  [name] was observed post Covid-19 immunization for 30 minutes based on pre-vaccination screening without incident. She was provided with Vaccine Information Sheet and instruction to access the V-Safe system.   [name] was instructed to call 911 with any severe reactions post vaccine: Marland Kitchen Difficulty breathing  . Swelling of face and throat  . A fast heartbeat  . A bad rash all over body  . Dizziness and weakness   Immunizations Administered    Name Date Dose VIS Date Route   Pfizer COVID-19 Vaccine 07/30/2019  1:08 PM 0.3 mL 04/10/2019 Intramuscular   Manufacturer: [employer], [employer]   Lot: [id number]   NDC: [address]-9

## 2019-07-31 ENCOUNTER — Other Ambulatory Visit: Payer: Self-pay | Admitting: Family Medicine

## 2019-07-31 DIAGNOSIS — L709 Acne, unspecified: Secondary | ICD-10-CM

## 2019-08-03 NOTE — Telephone Encounter (Signed)
Called to inform we have received paperwork. Have been trying to contact the patient regarding information when she wants to return to work. Have left several messages to call back. [employer] informed paperwork received.

## 2019-08-04 NOTE — Telephone Encounter (Signed)
Called the patient left detailed message to call back regarding PCP needing to complete her [employer] paperwork.

## 2019-08-04 NOTE — Telephone Encounter (Signed)
Called left message to call back 

## 2019-08-05 NOTE — Telephone Encounter (Signed)
Called left message to call back in regards to return to work paperwork (paper is on my desk to be completed by PCP)

## 2019-08-08 ENCOUNTER — Other Ambulatory Visit: Payer: Self-pay | Admitting: Family Medicine

## 2019-08-08 DIAGNOSIS — F13939 Sedative, hypnotic or anxiolytic use, unspecified with withdrawal, unspecified: Secondary | ICD-10-CM

## 2019-08-08 DIAGNOSIS — F13239 Sedative, hypnotic or anxiolytic dependence with withdrawal, unspecified: Secondary | ICD-10-CM

## 2019-08-08 DIAGNOSIS — R110 Nausea: Secondary | ICD-10-CM

## 2019-08-10 NOTE — Telephone Encounter (Signed)
Left message on machine to call back  

## 2019-08-12 NOTE — Telephone Encounter (Signed)
Send a letter or get in touch with emergency contact. Many of her immediate family members are in the area. Ty.

## 2019-08-12 NOTE — Telephone Encounter (Signed)
Called the patient left message to call back. Unable to get on the phone and have called several days now and no return call back from this patient Advise

## 2019-08-12 NOTE — Telephone Encounter (Signed)
I called and spoke to her mom and she will have her call us this afternoon.

## 2019-08-14 NOTE — Telephone Encounter (Signed)
Called again and no answer. Will close this note

## 2019-08-18 ENCOUNTER — Other Ambulatory Visit: Payer: Self-pay | Admitting: Family Medicine

## 2019-08-18 DIAGNOSIS — R110 Nausea: Secondary | ICD-10-CM

## 2019-08-31 ENCOUNTER — Ambulatory Visit: Payer: Self-pay | Attending: Internal Medicine

## 2019-08-31 DIAGNOSIS — Z23 Encounter for immunization: Secondary | ICD-10-CM

## 2019-08-31 NOTE — Progress Notes (Signed)
   Covid-19 Vaccination Clinic  Name:  [name]    MRN: [id number] DOB: May 20, 1985  08/31/2019  [name] was observed post Covid-19 immunization for 15 minutes without incident. She was provided with Vaccine Information Sheet and instruction to access the V-Safe system.   [name] was instructed to call 911 with any severe reactions post vaccine: Marland Kitchen Difficulty breathing  . Swelling of face and throat  . A fast heartbeat  . A bad rash all over body  . Dizziness and weakness   Immunizations Administered    Name Date Dose VIS Date Route   Pfizer COVID-19 Vaccine 08/31/2019 11:14 AM 0.3 mL 06/24/2018 Intramuscular   Manufacturer: [employer], [employer]   Lot: [id number]   NDC: [address]-6

## 2019-09-01 ENCOUNTER — Other Ambulatory Visit: Payer: Self-pay | Admitting: Family Medicine

## 2019-09-01 DIAGNOSIS — L709 Acne, unspecified: Secondary | ICD-10-CM

## 2019-09-08 ENCOUNTER — Other Ambulatory Visit: Payer: Self-pay | Admitting: Family Medicine

## 2019-09-08 DIAGNOSIS — R110 Nausea: Secondary | ICD-10-CM

## 2019-09-16 ENCOUNTER — Other Ambulatory Visit: Payer: Self-pay | Admitting: Family Medicine

## 2019-09-16 DIAGNOSIS — F13239 Sedative, hypnotic or anxiolytic dependence with withdrawal, unspecified: Secondary | ICD-10-CM

## 2019-09-16 DIAGNOSIS — F13939 Sedative, hypnotic or anxiolytic use, unspecified with withdrawal, unspecified: Secondary | ICD-10-CM

## 2019-09-16 DIAGNOSIS — R110 Nausea: Secondary | ICD-10-CM

## 2019-09-29 ENCOUNTER — Other Ambulatory Visit: Payer: Self-pay | Admitting: Family Medicine

## 2019-09-29 DIAGNOSIS — R110 Nausea: Secondary | ICD-10-CM

## 2019-10-07 ENCOUNTER — Other Ambulatory Visit: Payer: Self-pay | Admitting: Family Medicine

## 2019-10-07 DIAGNOSIS — R110 Nausea: Secondary | ICD-10-CM

## 2019-10-16 ENCOUNTER — Other Ambulatory Visit: Payer: Self-pay | Admitting: Family Medicine

## 2019-10-16 DIAGNOSIS — R110 Nausea: Secondary | ICD-10-CM

## 2019-10-22 ENCOUNTER — Other Ambulatory Visit: Payer: Self-pay | Admitting: Family Medicine

## 2019-10-22 DIAGNOSIS — F13239 Sedative, hypnotic or anxiolytic dependence with withdrawal, unspecified: Secondary | ICD-10-CM

## 2019-10-22 DIAGNOSIS — F13939 Sedative, hypnotic or anxiolytic use, unspecified with withdrawal, unspecified: Secondary | ICD-10-CM

## 2019-10-22 DIAGNOSIS — R110 Nausea: Secondary | ICD-10-CM

## 2019-10-28 ENCOUNTER — Encounter: Payer: Self-pay | Admitting: Family Medicine

## 2019-10-28 ENCOUNTER — Other Ambulatory Visit: Payer: Self-pay

## 2019-10-28 ENCOUNTER — Telehealth (INDEPENDENT_AMBULATORY_CARE_PROVIDER_SITE_OTHER): Payer: Self-pay | Admitting: Family Medicine

## 2019-10-28 DIAGNOSIS — F13939 Sedative, hypnotic or anxiolytic use, unspecified with withdrawal, unspecified: Secondary | ICD-10-CM

## 2019-10-28 DIAGNOSIS — F13239 Sedative, hypnotic or anxiolytic dependence with withdrawal, unspecified: Secondary | ICD-10-CM

## 2019-10-28 DIAGNOSIS — R110 Nausea: Secondary | ICD-10-CM

## 2019-10-28 MED ORDER — PROMETHAZINE HCL 25 MG PO TABS: 25.0000 mg | ORAL_TABLET | Freq: Three times a day (TID) | ORAL | 2 refills | Status: DC | PRN

## 2019-10-28 MED ORDER — GABAPENTIN 300 MG PO CAPS: 300.0000 mg | ORAL_CAPSULE | Freq: Four times a day (QID) | ORAL | 2 refills | Status: DC

## 2019-10-28 MED ORDER — VALACYCLOVIR HCL 500 MG PO TABS: 500.0000 mg | ORAL_TABLET | Freq: Two times a day (BID) | ORAL | 2 refills | Status: DC

## 2019-10-28 NOTE — Progress Notes (Signed)
Chief Complaint  Patient presents with  . Follow-up    Subjective: Patient is a 34 y.o. female here for f/u. Due to COVID-19 pandemic, we are interacting via web portal for an electronic face-to-face visit. I verified patient's ID using 2 identifiers. Patient agreed to proceed with visit via this method. Patient is in a parked car, I am at office. Patient and I are present for visit.   Patient was put through a weaning protocol for Ativan around 3 months ago.  She was taking gabapentin 600 mg 4 times daily and Phenergan every 6 hours 25 mg tabs.  She is currently taking 300 mg of gabapentin 4 times daily and Phenergan 25 mg every 8 hours on average.  Anytime she weans down further, she finds that she has more nausea, vomiting, and diarrhea.  She is completely off of the Ativan and has been so over the past 3 months.  She is wondering if she can stay on these medications and what to do for weaning.  Past Medical History:  Diagnosis Date  . Anxiety   . Asthma   . Chicken pox   . Depression    situational depression dcd all meds  . Headache     Objective: No conversational dyspnea Age appropriate judgment and insight Nml affect and mood  Assessment and Plan: Benzodiazepine withdrawal, with unspecified complication (HCC) - Plan: gabapentin (NEURONTIN) 300 MG capsule  Nausea - Plan: promethazine (PHENERGAN) 25 MG tablet  Okay to continue gabapentin and Phenergan.  I did reach out to the pharmacy team who recommended a slow withdrawal from these.  I am okay to continue these as long as she needs. The patient voiced understanding and agreement to the plan.  Jilda Roche [place], DO 10/28/19  1:29 PM

## 2019-12-06 ENCOUNTER — Other Ambulatory Visit: Payer: Self-pay | Admitting: Family Medicine

## 2019-12-21 ENCOUNTER — Telehealth: Payer: Self-pay | Admitting: Family Medicine

## 2019-12-21 NOTE — Telephone Encounter (Signed)
That's fine. Ty.  

## 2019-12-21 NOTE — Telephone Encounter (Signed)
Caller: Lisa Dyer Call back # [phone]  Patient need a tb blood test for work and is wondering if you could place an order

## 2019-12-21 NOTE — Telephone Encounter (Signed)
Patient scheduled physical for Wednesday for job/can do lab work then.

## 2019-12-23 ENCOUNTER — Encounter: Payer: Self-pay | Admitting: Family Medicine

## 2019-12-23 ENCOUNTER — Ambulatory Visit (INDEPENDENT_AMBULATORY_CARE_PROVIDER_SITE_OTHER): Payer: Self-pay | Admitting: Family Medicine

## 2019-12-23 ENCOUNTER — Other Ambulatory Visit: Payer: Self-pay

## 2019-12-23 VITALS — BP 122/68 | HR 101 | Temp 98.8°F | Ht 60.0 in | Wt 167.2 lb

## 2019-12-23 DIAGNOSIS — Z111 Encounter for screening for respiratory tuberculosis: Secondary | ICD-10-CM

## 2019-12-23 DIAGNOSIS — Z0000 Encounter for general adult medical examination without abnormal findings: Secondary | ICD-10-CM

## 2019-12-23 LAB — TSH: TSH: 1.56 mIU/L

## 2019-12-23 NOTE — Progress Notes (Signed)
Chief Complaint  Patient presents with  . Annual Exam     Well Woman [name] is here for a complete physical.   Her last physical was >1 year ago.  Current diet: in general, diet is fair. Current exercise: walking, yoga, stretching. Wt stable? Yes  Fatigue out of ordinary? No Seatbelt? Yes  Health Maintenance Pap/HPV- Yes Tetanus- Yes HIV screening- Yes Hep C screening- No  Past Medical History:  Diagnosis Date  . Anxiety   . Asthma   . Chicken pox   . Depression    situational depression dcd all meds  . Headache      Past Surgical History:  Procedure Laterality Date  . BREAST SURGERY    . CESAREAN SECTION    . CESAREAN SECTION N/A 08/23/2016   Procedure: REPEAT CESAREAN SECTION;  Surgeon: Marcelle Overlie, MD;  Location: [facility] BIRTHING SUIT[facility];  Service: Obstetrics;  Laterality: N/A;  Repeat -edc 5/3 -allergy to PCN, NSAIDS, tramadol need RNFA - [name] to RNFA  . fallopian tube removal    . HERNIA REPAIR      Medications  Current Outpatient Medications on File Prior to Visit  Medication Sig Dispense Refill  . benzoyl peroxide (BENZOYL PEROXIDE) 5 % external liquid Apply topically 2 (two) times daily. 142 g 12  . BLISOVI 24 FE 1-20 MG-MCG(24) tablet TAKE 1 TABLET BY MOUTH EVERY DAY 28 tablet 10  . busPIRone (BUSPAR) 15 MG tablet Take 15 mg by mouth daily.    . clindamycin (CLINDAGEL) 1 % gel APPLY TO AFFECTED AREA TWICE A DAY 30 g 0  . escitalopram (LEXAPRO) 10 MG tablet TAKE 1 TABLET BY MOUTH EVERY DAY 30 tablet 1  . gabapentin (NEURONTIN) 300 MG capsule Take 1 capsule (300 mg total) by mouth 4 (four) times daily. 360 capsule 2  . promethazine (PHENERGAN) 25 MG tablet Take 1 tablet (25 mg total) by mouth every 8 (eight) hours as needed for nausea or vomiting. 270 tablet 2  . valACYclovir (VALTREX) 500 MG tablet Take 1 tablet (500 mg total) by mouth 2 (two) times daily. 180 tablet 2    Allergies Allergies  Allergen Reactions  . Nsaids Anaphylaxis  .  Penicillins Rash    Has patient had a PCN reaction causing immediate rash, facial/tongue/throat swelling, SOB or lightheadedness with hypotension: Yes Has patient had a PCN reaction causing severe rash involving mucus membranes or skin necrosis: Yes Has patient had a PCN reaction that required hospitalization No Has patient had a PCN reaction occurring within the last 10 years: No If all of the above answers are "NO", then may proceed with Cephalosporin use.  . Tramadol Other (See Comments)    Severe vomiting    Review of Systems: Constitutional:  +wt gain Eye:  no recent significant change in vision Ear/Nose/Mouth/Throat:  Ears:  no tinnitus or vertigo and no recent change in hearing Nose/Mouth/Throat:  no complaints of nasal congestion, no sore throat Cardiovascular: no chest pain Respiratory:  no cough and no shortness of breath Gastrointestinal:  no abdominal pain, no change in bowel habits GU:  Female: negative for dysuria or pelvic pain Musculoskeletal/Extremities:  no pain of the joints Integumentary (Skin/Breast):  no abnormal skin lesions reported Neurologic:  no headaches Endocrine:  denies fatigue Hematologic/Lymphatic:  No areas of easy bleeding  Exam BP 122/68 (BP Location: Right Arm, Patient Position: Sitting, Cuff Size: Normal)   Pulse (!) 101   Temp 98.8 F (37.1 C) (Oral)   Ht 5' (1.524  m)   Wt 167 lb 4 oz (75.9 kg)   SpO2 97%   BMI 32.66 kg/m  General:  well developed, well nourished, in no apparent distress Skin:  no significant moles, warts, or growths Head:  no masses, lesions, or tenderness Eyes:  pupils equal and round, sclera anicteric without injection Ears:  canals without lesions, TMs shiny without retraction, no obvious effusion, no erythema Nose:  nares patent, septum midline, mucosa normal, and no drainage or sinus tenderness Throat/Pharynx:  lips and gingiva without lesion; tongue and uvula midline; non-inflamed pharynx; no exudates or postnasal  drainage Neck: neck supple without adenopathy, thyromegaly, or masses Lungs:  clear to auscultation, breath sounds equal bilaterally, no respiratory distress Cardio:  regular rate and rhythm, no bruits, no LE edema Abdomen:  abdomen soft, nontender; bowel sounds normal; no masses or organomegaly Genital: Defer to GYN Musculoskeletal:  symmetrical muscle groups noted without atrophy or deformity Extremities:  no clubbing, cyanosis, or edema, no deformities, no skin discoloration Neuro:  gait normal; deep tendon reflexes normal and symmetric Psych: well oriented with normal range of affect and appropriate judgment/insight  Assessment and Plan  Well adult exam - Plan: Lipid panel, CBC, Comprehensive metabolic panel, TSH, CANCELED: TSH  Screening-pulmonary TB - Plan: QuantiFERON-TB Gold Plus   Well 34 y.o. female. Counseled on diet and exercise. Other orders as above. For the weight gain, she will try diet that has been successful for her sister and mother.  Could consider weight loss clinic.  Could consider GLP-1.  Could consider changing Lexapro. Follow up in 6 mo or prn. The patient voiced understanding and agreement to the plan.  Lisa Roche [place], DO 12/23/19 12:06 PM

## 2019-12-23 NOTE — Patient Instructions (Addendum)
I recommend getting the flu shot in mid October. This suggestion would change if the CDC comes out with a different recommendation.   Keep the diet clean and stay active.  Aim to do some physical exertion for 150 minutes per week. This is typically divided into 5 days per week, 30 minutes per day. The activity should be enough to get your heart rate up. Anything is better than nothing if you have time constraints.  Give [place] 2-3 business days to get the results of your labs back.   Let us know if you need anything.

## 2019-12-25 ENCOUNTER — Other Ambulatory Visit: Payer: Self-pay | Admitting: Family Medicine

## 2019-12-25 DIAGNOSIS — E785 Hyperlipidemia, unspecified: Secondary | ICD-10-CM

## 2019-12-25 LAB — LIPID PANEL
Cholesterol: 218 mg/dL — ABNORMAL HIGH (ref <200)
HDL: 59 mg/dL (ref >=50)
LDL Cholesterol (Calc): 136 mg/dL (calc) — ABNORMAL HIGH
Non-HDL Cholesterol (Calc): 159 mg/dL (calc) — ABNORMAL HIGH (ref <130)
Total CHOL/HDL Ratio: 3.7 (calc) (ref <5.0)
Triglycerides: 118 mg/dL (ref <150)

## 2019-12-25 LAB — COMPREHENSIVE METABOLIC PANEL
AG Ratio: 1.4 (calc) (ref 1.0–2.5)
ALT: 5 U/L — ABNORMAL LOW (ref 6–29)
AST: 13 U/L (ref 10–30)
Albumin: 4.2 g/dL (ref 3.6–5.1)
Alkaline phosphatase (APISO): 44 U/L (ref 31–125)
BUN: 10 mg/dL (ref 7–25)
CO2: 27 mmol/L (ref 20–32)
Calcium: 9 mg/dL (ref 8.6–10.2)
Chloride: 103 mmol/L (ref 98–110)
Creat: 0.72 mg/dL (ref 0.50–1.10)
Globulin: 2.9 g/dL (calc) (ref 1.9–3.7)
Glucose, Bld: 90 mg/dL (ref 65–99)
Potassium: 4.6 mmol/L (ref 3.5–5.3)
Sodium: 138 mmol/L (ref 135–146)
Total Bilirubin: 0.4 mg/dL (ref 0.2–1.2)
Total Protein: 7.1 g/dL (ref 6.1–8.1)

## 2019-12-25 LAB — QUANTIFERON-TB GOLD PLUS
Mitogen-NIL: >10 IU/mL
NIL: 0.04 IU/mL
QuantiFERON-TB Gold Plus: NEGATIVE
TB1-NIL: 0.04 IU/mL
TB2-NIL: 0.03 IU/mL

## 2019-12-25 LAB — CBC
HCT: 35.4 % (ref 35.0–45.0)
Hemoglobin: 11.8 g/dL (ref 11.7–15.5)
MCH: 30.4 pg (ref 27.0–33.0)
MCHC: 33.3 g/dL (ref 32.0–36.0)
MCV: 91.2 fL (ref 80.0–100.0)
MPV: 11.9 fL (ref 7.5–12.5)
Platelets: 313 10*3/uL (ref 140–400)
RBC: 3.88 10*6/uL (ref 3.80–5.10)
RDW: 12.9 % (ref 11.0–15.0)
WBC: 7.4 10*3/uL (ref 3.8–10.8)

## 2019-12-25 NOTE — Progress Notes (Signed)
lipi

## 2020-03-15 ENCOUNTER — Ambulatory Visit: Payer: Self-pay

## 2020-03-28 ENCOUNTER — Other Ambulatory Visit: Payer: Self-pay

## 2020-05-13 ENCOUNTER — Telehealth (INDEPENDENT_AMBULATORY_CARE_PROVIDER_SITE_OTHER): Payer: BC Managed Care – PPO | Admitting: Family Medicine

## 2020-05-13 ENCOUNTER — Other Ambulatory Visit: Payer: Self-pay

## 2020-05-13 ENCOUNTER — Encounter: Payer: Self-pay | Admitting: Family Medicine

## 2020-05-13 VITALS — Temp 98.8°F

## 2020-05-13 DIAGNOSIS — U071 COVID-19: Secondary | ICD-10-CM | POA: Diagnosis not present

## 2020-05-13 MED ORDER — PREDNISONE 20 MG PO TABS: 40.0000 mg | ORAL_TABLET | Freq: Every day | ORAL | 0 refills | Status: AC

## 2020-05-13 MED ORDER — BENZONATATE 100 MG PO CAPS: 100.0000 mg | ORAL_CAPSULE | Freq: Three times a day (TID) | ORAL | 0 refills | Status: DC | PRN

## 2020-05-13 MED ORDER — ONDANSETRON 4 MG PO TBDP: 4.0000 mg | ORAL_TABLET | Freq: Three times a day (TID) | ORAL | 0 refills | Status: DC | PRN

## 2020-05-13 MED ORDER — PROMETHAZINE-CODEINE 6.25-10 MG/5ML PO SYRP: 5.0000 mL | ORAL_SOLUTION | Freq: Four times a day (QID) | ORAL | 0 refills | Status: DC | PRN

## 2020-05-13 NOTE — Progress Notes (Signed)
Chief Complaint  Patient presents with  . Covid Positive  . Cough  . Fatigue  . Headache    Lisa Dyer here for URI complaints. Due to COVID-19 pandemic, we are interacting via telephone. I verified patient's ID using 2 identifiers. Patient agreed to proceed with visit via this method. Patient is at home, I am at office. Patient and I are present for visit.   Duration: 3 days  Associated symptoms: Fever (100.1 F), sinus congestion, rhinorrhea, sore throat, wheezing, shortness of breath, myalgia and cough Denies: sinus pain, itchy watery eyes, ear pain, ear drainage and vomiting, diarrhea Treatment to date: Mucinex, OTC cough med Sick contacts: Yes- spouse   Tested positive for covid. She is vaccinated x 3, Pfizer.    Past Medical History:  Diagnosis Date  . Anxiety   . Asthma   . Chicken pox   . Depression    situational depression dcd all meds  . Headache    Exam Temp 98.8 F (37.1 C) (Oral)  No conversational dyspnea Age appropriate judgment and insight Nml affect and mood   COVID-19 - Plan: ondansetron (ZOFRAN-ODT) 4 MG disintegrating tablet, predniSONE (DELTASONE) 20 MG tablet, benzonatate (TESSALON) 100 MG capsule, promethazine-codeine (PHENERGAN WITH CODEINE) 6.25-10 MG/5ML syrup  5 d pred burst 40 mg/d. She has a mild hx of asthma, feels better if she could use. Warnings about syrup verbalized. 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 Zofran prn so she can push fluids.  Continue to push fluids, practice good hand hygiene, cover mouth when coughing. F/u prn. If starting to experience irreplaceable fluid loss, shaking, or shortness of breath, seek immediate care. Total time: 11 min Pt voiced understanding and agreement to the plan.  Jilda Roche [place], DO 05/13/20 4:12 PM

## 2020-05-19 ENCOUNTER — Other Ambulatory Visit: Payer: Self-pay | Admitting: Family Medicine

## 2020-05-19 DIAGNOSIS — U071 COVID-19: Secondary | ICD-10-CM

## 2020-05-24 ENCOUNTER — Ambulatory Visit (INDEPENDENT_AMBULATORY_CARE_PROVIDER_SITE_OTHER): Payer: BC Managed Care – PPO | Admitting: Family Medicine

## 2020-05-24 ENCOUNTER — Encounter: Payer: Self-pay | Admitting: Family Medicine

## 2020-05-24 ENCOUNTER — Other Ambulatory Visit: Payer: Self-pay

## 2020-05-24 VITALS — BP 120/80 | HR 104 | Temp 98.6°F | Ht 60.0 in | Wt 157.0 lb

## 2020-05-24 DIAGNOSIS — R202 Paresthesia of skin: Secondary | ICD-10-CM | POA: Diagnosis not present

## 2020-05-24 DIAGNOSIS — G629 Polyneuropathy, unspecified: Secondary | ICD-10-CM

## 2020-05-24 LAB — COMPREHENSIVE METABOLIC PANEL
ALT: 11 U/L (ref 0–35)
AST: 12 U/L (ref 0–37)
Albumin: 4.2 g/dL (ref 3.5–5.2)
Alkaline Phosphatase: 40 U/L (ref 39–117)
BUN: 10 mg/dL (ref 6–23)
CO2: 27 mEq/L (ref 19–32)
Calcium: 8.9 mg/dL (ref 8.4–10.5)
Chloride: 106 mEq/L (ref 96–112)
Creatinine, Ser: 0.6 mg/dL (ref 0.40–1.20)
GFR: 115.76 mL/min (ref >60.00)
Glucose, Bld: 92 mg/dL (ref 70–99)
Potassium: 4.2 mEq/L (ref 3.5–5.1)
Sodium: 139 mEq/L (ref 135–145)
Total Bilirubin: 0.4 mg/dL (ref 0.2–1.2)
Total Protein: 6.9 g/dL (ref 6.0–8.3)

## 2020-05-24 LAB — CBC
HCT: 36.2 % (ref 36.0–46.0)
Hemoglobin: 12.1 g/dL (ref 12.0–15.0)
MCHC: 33.4 g/dL (ref 30.0–36.0)
MCV: 88.5 fl (ref 78.0–100.0)
Platelets: 352 10*3/uL (ref 150.0–400.0)
RBC: 4.09 Mil/uL (ref 3.87–5.11)
RDW: 14 % (ref 11.5–15.5)
WBC: 6.8 10*3/uL (ref 4.0–10.5)

## 2020-05-24 LAB — VITAMIN D 25 HYDROXY (VIT D DEFICIENCY, FRACTURES): VITD: 18.44 ng/mL — ABNORMAL LOW (ref 30.00–100.00)

## 2020-05-24 LAB — VITAMIN B12: Vitamin B-12: 306 pg/mL (ref 211–911)

## 2020-05-24 LAB — TSH: TSH: 0.36 u[IU]/mL (ref 0.35–4.50)

## 2020-05-24 LAB — MAGNESIUM: Magnesium: 1.8 mg/dL (ref 1.5–2.5)

## 2020-05-24 LAB — HEMOGLOBIN A1C: Hgb A1c MFr Bld: 5.5 % (ref 4.6–6.5)

## 2020-05-24 MED ORDER — DULOXETINE HCL 30 MG PO CPEP: 30.0000 mg | ORAL_CAPSULE | Freq: Every day | ORAL | 3 refills | Status: DC

## 2020-05-24 NOTE — Patient Instructions (Signed)
Give [place] 2-3 business days to get the results of your labs back.   See how the Cymbalta works with the gabapentin.  If labs are normal, we will set you up with a hand specialist.   Let us know if you need anything.

## 2020-05-24 NOTE — Progress Notes (Signed)
Chief Complaint  Patient presents with  . Numbness    Hands Tingling of hands up to elbows    Subjective: Patient is a 35 y.o. female here for tingling.  2 d ago woke up with tingling, numbness and burning pain in hands and forearms. Worse on R. Swelling in hands. No redness or bruising. No inj or change in activity. Feels anterior forearm. If she holds her hand up, it feels more numb. Entire hand is affected. Of note, she is on gabapentin 300 mg qid for benzo withdrawal. +neck and back pain.   Past Medical History:  Diagnosis Date  . Anxiety   . Asthma   . Chicken pox   . Depression    situational depression dcd all meds  . Headache     Objective: BP 120/80 (BP Location: Left Arm, Patient Position: Sitting, Cuff Size: Normal)   Pulse (!) 104   Temp 98.6 F (37 C) (Oral)   Ht 5' (1.524 m)   Wt 157 lb (71.2 kg)   SpO2 96%   BMI 30.66 kg/m  General: Awake, appears stated age Neuro: DTR's equal and symmetric throughout UE's.  Decreased sensation to pinprick on the hands bilaterally, worse at the right middle finger pad; grip strength 5/5, upper extremity strength 5/5 bilaterally throughout; negative Spurling's MSK: Mild tenderness to palpation over bilateral cervical paraspinal musculature. Heart: Brisk capillary refill Lungs: No accessory muscle use Psych: Age appropriate judgment and insight, normal affect and mood  Assessment and Plan: Paresthesias - Plan: DULoxetine (CYMBALTA) 30 MG capsule, TSH, Hemoglobin A1c, Comprehensive metabolic panel, CBC, B12, Magnesium, VITAMIN D 25 Hydroxy (Vit-D Deficiency, Fractures)  Neuropathy - Plan: DULoxetine (CYMBALTA) 30 MG capsule  Check above labs.  If normal, will refer to hand.  Neuro would be ideal but they are likely a 64-month wait.  Trial Cymbalta 30 mg daily.  She is no longer taking Lexapro.  Continue gabapentin. The patient voiced understanding and agreement to the plan.  Jilda Roche [place], DO 05/24/20  1:29  PM

## 2020-05-25 ENCOUNTER — Other Ambulatory Visit: Payer: Self-pay | Admitting: Family Medicine

## 2020-05-25 DIAGNOSIS — M792 Neuralgia and neuritis, unspecified: Secondary | ICD-10-CM

## 2020-05-25 DIAGNOSIS — E559 Vitamin D deficiency, unspecified: Secondary | ICD-10-CM

## 2020-05-25 MED ORDER — VITAMIN D (ERGOCALCIFEROL) 1.25 MG (50000 UNIT) PO CAPS: 50000.0000 [IU] | ORAL_CAPSULE | ORAL | 0 refills | Status: DC

## 2020-06-16 ENCOUNTER — Other Ambulatory Visit: Payer: Self-pay | Admitting: Family Medicine

## 2020-06-16 DIAGNOSIS — R202 Paresthesia of skin: Secondary | ICD-10-CM

## 2020-06-16 DIAGNOSIS — G629 Polyneuropathy, unspecified: Secondary | ICD-10-CM

## 2020-06-24 ENCOUNTER — Ambulatory Visit: Payer: Self-pay | Admitting: Family Medicine

## 2020-06-24 DIAGNOSIS — Z0289 Encounter for other administrative examinations: Secondary | ICD-10-CM

## 2020-08-18 ENCOUNTER — Other Ambulatory Visit: Payer: Self-pay | Admitting: Family Medicine

## 2020-08-18 DIAGNOSIS — U071 COVID-19: Secondary | ICD-10-CM

## 2020-08-30 ENCOUNTER — Other Ambulatory Visit: Payer: BC Managed Care – PPO

## 2020-09-02 ENCOUNTER — Other Ambulatory Visit: Payer: Self-pay | Admitting: Family Medicine

## 2020-09-02 DIAGNOSIS — U071 COVID-19: Secondary | ICD-10-CM

## 2020-09-02 MED ORDER — PROMETHAZINE-CODEINE 6.25-10 MG/5ML PO SYRP: ORAL_SOLUTION | ORAL | 0 refills | Status: DC

## 2020-09-13 ENCOUNTER — Other Ambulatory Visit: Payer: Self-pay | Admitting: Family Medicine

## 2020-09-13 DIAGNOSIS — G629 Polyneuropathy, unspecified: Secondary | ICD-10-CM

## 2020-09-13 DIAGNOSIS — U071 COVID-19: Secondary | ICD-10-CM

## 2020-09-13 DIAGNOSIS — R202 Paresthesia of skin: Secondary | ICD-10-CM

## 2020-10-15 ENCOUNTER — Other Ambulatory Visit: Payer: Self-pay | Admitting: Family Medicine

## 2020-10-15 DIAGNOSIS — R202 Paresthesia of skin: Secondary | ICD-10-CM

## 2020-10-15 DIAGNOSIS — G629 Polyneuropathy, unspecified: Secondary | ICD-10-CM

## 2020-10-24 ENCOUNTER — Ambulatory Visit: Payer: BC Managed Care – PPO | Admitting: Family Medicine

## 2020-10-26 ENCOUNTER — Encounter: Payer: Self-pay | Admitting: Family Medicine

## 2020-11-01 ENCOUNTER — Other Ambulatory Visit: Payer: Self-pay | Admitting: Family Medicine

## 2020-11-03 DIAGNOSIS — Z5329 Procedure and treatment not carried out because of patient's decision for other reasons: Secondary | ICD-10-CM | POA: Diagnosis not present

## 2020-11-03 DIAGNOSIS — R109 Unspecified abdominal pain: Secondary | ICD-10-CM | POA: Diagnosis not present

## 2020-11-03 DIAGNOSIS — R1013 Epigastric pain: Secondary | ICD-10-CM | POA: Diagnosis not present

## 2020-11-08 ENCOUNTER — Other Ambulatory Visit: Payer: Self-pay | Admitting: Family Medicine

## 2020-11-08 DIAGNOSIS — F13939 Sedative, hypnotic or anxiolytic use, unspecified with withdrawal, unspecified: Secondary | ICD-10-CM

## 2020-11-08 DIAGNOSIS — F13239 Sedative, hypnotic or anxiolytic dependence with withdrawal, unspecified: Secondary | ICD-10-CM

## 2020-11-08 NOTE — Telephone Encounter (Signed)
Sent a recent no show letter.

## 2020-11-29 ENCOUNTER — Other Ambulatory Visit: Payer: Self-pay

## 2020-11-29 ENCOUNTER — Ambulatory Visit (INDEPENDENT_AMBULATORY_CARE_PROVIDER_SITE_OTHER): Payer: BC Managed Care – PPO | Admitting: Family Medicine

## 2020-11-29 ENCOUNTER — Other Ambulatory Visit: Payer: Self-pay | Admitting: Family Medicine

## 2020-11-29 ENCOUNTER — Encounter: Payer: Self-pay | Admitting: Family Medicine

## 2020-11-29 VITALS — BP 104/68 | HR 80 | Temp 97.9°F | Ht 60.0 in | Wt 151.0 lb

## 2020-11-29 DIAGNOSIS — R6339 Other feeding difficulties: Secondary | ICD-10-CM | POA: Diagnosis not present

## 2020-11-29 DIAGNOSIS — R110 Nausea: Secondary | ICD-10-CM

## 2020-11-29 DIAGNOSIS — G4700 Insomnia, unspecified: Secondary | ICD-10-CM | POA: Diagnosis not present

## 2020-11-29 LAB — TSH: TSH: 1.11 u[IU]/mL (ref 0.35–5.50)

## 2020-11-29 LAB — T4, FREE: Free T4: 0.58 ng/dL — ABNORMAL LOW (ref 0.60–1.60)

## 2020-11-29 MED ORDER — DRONABINOL 5 MG PO CAPS: 5.0000 mg | ORAL_CAPSULE | Freq: Two times a day (BID) | ORAL | 2 refills | Status: DC

## 2020-11-29 MED ORDER — NORGESTIM-ETH ESTRAD TRIPHASIC 0.18/0.215/0.25 MG-35 MCG PO TABS: 1.0000 | ORAL_TABLET | Freq: Every day | ORAL | 11 refills | Status: DC

## 2020-11-29 NOTE — Patient Instructions (Addendum)
Let me know if there are cost issues.   Give [place] 2-3 business days to get the results of your labs back.   Let us know if you need anything.

## 2020-11-29 NOTE — Progress Notes (Signed)
Chief Complaint  Patient presents with   Follow-up   Insomnia    Fatigue/lack of energy Discuss lab results    Subjective: Patient is a 35 y.o. female here for fu.  Over the past 2 months, the patient is experiencing more food aversion, weight loss, and poor appetite.  She used to be on high doses of Ativan totaling more than 6 mg daily.  She was able to successfully wean herself from this but has been having issues with this ever since.  She was self-medicating with marijuana that was helpful.  She decided to stop and has been having worsening aversion to meat and has lost 16 pounds.  Additionally, she has been having more difficulty sleeping.  Past Medical History:  Diagnosis Date   Anxiety    Asthma    Chicken pox    Depression    situational depression dcd all meds   Headache     Objective: BP 104/68   Pulse 80   Temp 97.9 F (36.6 C) (Oral)   Ht 5' (1.524 m)   Wt 151 lb (68.5 kg)   SpO2 99%   BMI 29.49 kg/m  General: Awake, appears stated age Lungs: No accessory muscle use Psych: Age appropriate judgment and insight, normal affect and mood  Assessment and Plan: Food aversion - Plan: dronabinol (MARINOL) 5 MG capsule  Insomnia, unspecified type - Plan: dronabinol (MARINOL) 5 MG capsule, TSH, T4, free  New problems with uncertain prognosis.  Trial Marinol given weight loss of 16 pounds.  If insurance does not cover, we will send in mirtazapine.  Follow-up in 1 month. The patient voiced understanding and agreement to the plan.  Jilda Roche [place], DO 11/29/20  12:10 PM

## 2020-11-30 ENCOUNTER — Other Ambulatory Visit: Payer: Self-pay | Admitting: Family Medicine

## 2020-11-30 DIAGNOSIS — G4700 Insomnia, unspecified: Secondary | ICD-10-CM

## 2021-01-18 ENCOUNTER — Other Ambulatory Visit: Payer: Self-pay | Admitting: Family Medicine

## 2021-01-18 DIAGNOSIS — G4700 Insomnia, unspecified: Secondary | ICD-10-CM

## 2021-01-18 DIAGNOSIS — R6339 Other feeding difficulties: Secondary | ICD-10-CM

## 2021-01-22 ENCOUNTER — Other Ambulatory Visit: Payer: Self-pay

## 2021-01-22 ENCOUNTER — Emergency Department
Admission: EM | Admit: 2021-01-22 | Discharge: 2021-01-22 | Disposition: A | Discharge status: Home / Self Care | Payer: BC Managed Care – PPO | Attending: Emergency Medicine | Admitting: Emergency Medicine

## 2021-01-22 DIAGNOSIS — R1110 Vomiting, unspecified: Secondary | ICD-10-CM | POA: Insufficient documentation

## 2021-01-22 DIAGNOSIS — R1111 Vomiting without nausea: Secondary | ICD-10-CM | POA: Diagnosis not present

## 2021-01-22 DIAGNOSIS — Z5321 Procedure and treatment not carried out due to patient leaving prior to being seen by health care provider: Secondary | ICD-10-CM | POA: Diagnosis not present

## 2021-01-22 DIAGNOSIS — E860 Dehydration: Secondary | ICD-10-CM | POA: Diagnosis not present

## 2021-01-22 DIAGNOSIS — R58 Hemorrhage, not elsewhere classified: Secondary | ICD-10-CM | POA: Diagnosis not present

## 2021-01-22 LAB — COMPREHENSIVE METABOLIC PANEL
ALT: 15 U/L (ref 0–44)
AST: 22 U/L (ref 15–41)
Albumin: 3.9 g/dL (ref 3.5–5.0)
Alkaline Phosphatase: 50 U/L (ref 38–126)
Anion gap: 11 (ref 5–15)
BUN: 10 mg/dL (ref 6–20)
CO2: 26 mmol/L (ref 22–32)
Calcium: 8.5 mg/dL — ABNORMAL LOW (ref 8.9–10.3)
Chloride: 102 mmol/L (ref 98–111)
Creatinine, Ser: 0.59 mg/dL (ref 0.44–1.00)
GFR, Estimated: >60 mL/min (ref >60)
Glucose, Bld: 90 mg/dL (ref 70–99)
Potassium: 3.8 mmol/L (ref 3.5–5.1)
Sodium: 139 mmol/L (ref 135–145)
Total Bilirubin: 0.8 mg/dL (ref 0.3–1.2)
Total Protein: 7.5 g/dL (ref 6.5–8.1)

## 2021-01-22 LAB — CBC
HCT: 37 % (ref 36.0–46.0)
Hemoglobin: 12.4 g/dL (ref 12.0–15.0)
MCH: 29.8 pg (ref 26.0–34.0)
MCHC: 33.5 g/dL (ref 30.0–36.0)
MCV: 88.9 fL (ref 80.0–100.0)
Platelets: 352 10*3/uL (ref 150–400)
RBC: 4.16 MIL/uL (ref 3.87–5.11)
RDW: 14 % (ref 11.5–15.5)
WBC: 17 10*3/uL — ABNORMAL HIGH (ref 4.0–10.5)
nRBC: 0 % (ref 0.0–0.2)

## 2021-01-22 LAB — LIPASE, BLOOD: Lipase: 27 U/L (ref 11–51)

## 2021-01-22 NOTE — ED Notes (Signed)
Pt wants to leave and follow up with PCP. Has access to mychart and is aware labs are not back yet. Pt IV removed and left with friend. Made aware to come back if necessary.

## 2021-01-22 NOTE — ED Triage Notes (Signed)
First nurse note: Pt comes ems from home with vomiting since 2am. Has GI hx. Has rx for phenergan but kept vomiting through it. 22G L hand, IV zofran 4mg . States it helped. fluids.130/90. CBG 131.

## 2021-01-22 NOTE — ED Triage Notes (Addendum)
See first nurse note- Pt to ER via [facility]. Reports being woken up since 2am and repeatedly vomiting until 8am. Reports some abdominal pain, describes it as a burning sensation in her epigastric region. Denies urinary symptoms. Also reports throat pain, states when trying to drink liquids it burns. Reports she observed some blood streaked in her emesis.   Reports being seen at the ER several months ago for RUQ pain and was told she had sludge in her gall bladder.

## 2021-02-01 DIAGNOSIS — Z23 Encounter for immunization: Secondary | ICD-10-CM

## 2021-02-01 DIAGNOSIS — Z111 Encounter for screening for respiratory tuberculosis: Secondary | ICD-10-CM

## 2021-02-03 ENCOUNTER — Other Ambulatory Visit: Payer: Self-pay | Admitting: Family Medicine

## 2021-02-18 ENCOUNTER — Other Ambulatory Visit: Payer: Self-pay | Admitting: Family Medicine

## 2021-02-20 ENCOUNTER — Encounter: Payer: Self-pay | Admitting: Family Medicine

## 2021-02-20 ENCOUNTER — Ambulatory Visit (INDEPENDENT_AMBULATORY_CARE_PROVIDER_SITE_OTHER): Payer: BC Managed Care – PPO | Admitting: Family Medicine

## 2021-02-20 ENCOUNTER — Other Ambulatory Visit: Payer: Self-pay

## 2021-02-20 VITALS — BP 111/75 | HR 92 | Temp 98.3°F | Ht 60.0 in | Wt 156.4 lb

## 2021-02-20 DIAGNOSIS — G4700 Insomnia, unspecified: Secondary | ICD-10-CM

## 2021-02-20 DIAGNOSIS — Z23 Encounter for immunization: Secondary | ICD-10-CM | POA: Diagnosis not present

## 2021-02-20 DIAGNOSIS — R109 Unspecified abdominal pain: Secondary | ICD-10-CM | POA: Diagnosis not present

## 2021-02-20 DIAGNOSIS — R4586 Emotional lability: Secondary | ICD-10-CM

## 2021-02-20 DIAGNOSIS — R112 Nausea with vomiting, unspecified: Secondary | ICD-10-CM

## 2021-02-20 DIAGNOSIS — R6339 Other feeding difficulties: Secondary | ICD-10-CM | POA: Diagnosis not present

## 2021-02-20 MED ORDER — ARIPIPRAZOLE 2 MG PO TABS: 2.0000 mg | ORAL_TABLET | Freq: Every day | ORAL | 2 refills | Status: DC

## 2021-02-20 MED ORDER — DRONABINOL 5 MG PO CAPS: 5.0000 mg | ORAL_CAPSULE | Freq: Two times a day (BID) | ORAL | 1 refills | Status: DC

## 2021-02-20 MED ORDER — PANTOPRAZOLE SODIUM 40 MG PO TBEC: 40.0000 mg | DELAYED_RELEASE_TABLET | Freq: Every day | ORAL | 1 refills | Status: DC

## 2021-02-20 NOTE — Progress Notes (Signed)
Chief Complaint  Patient presents with   Follow-up    Subjective: Patient is a 35 y.o. female here for f/u.  Food aversion/weight loss The patient was seen around 2 and half months ago after losing 16 pounds unintentionally and having an aversion to certain types of foods.  She was started on Marinol 5 mg twice daily.  She is tolerating the medicine well and reports no foot side effects.  She is sleeping much better and reports some weight gain.  She is starting to eat much more normally.  She is currently content.  Nausea/vomiting/abdominal pain Over the past 3 months the patient has had several episodes of epigastric pain leading to vomiting, vomiting by itself, and a more mild version of both.  It seems to come randomly.  She denies any fevers, bowel changes, bleeding, or sick contacts.  She does not have any current issues right now.  She has associated sweating at night.  She denies any unintentional weight loss, travel to TB endemic areas, coughing, fevers.  The patient is taking Cymbalta 30 mg daily for her mood.  Over the past couple years she has had several days where she feels impulsive and high energy followed by several days of very low energy and not feeling like doing anything.  She did see a psychiatrist where she had high doses of Ativan prescribed.  She was never officially diagnosed with bipolar.  She has never taken a mood stabilizer.  She is not currently following with a counselor or psychologist.  No homicidal or suicidal ideation.  She is not self-medicating.  Past Medical History:  Diagnosis Date   Anxiety    Asthma    Chicken pox    Depression    situational depression dcd all meds   Headache     Objective: BP 111/75   Pulse 92   Temp 98.3 F (36.8 C) (Oral)   Ht 5' (1.524 m)   Wt 156 lb 6 oz (70.9 kg)   SpO2 99%   BMI 30.54 kg/m  General: Awake, appears stated age HEENT: MMM, EOMi Heart: RRR, no lower extremity edema Lungs: CTAB, no rales, wheezes or  rhonchi. No accessory muscle use Abdomen: Bowel sounds present, soft, nontender, nondistended, no masses or organomegaly Psych: Age appropriate judgment and insight, normal affect and mood  Assessment and Plan: Food aversion - Plan: dronabinol (MARINOL) 5 MG capsule  Insomnia, unspecified type - Plan: dronabinol (MARINOL) 5 MG capsule  Mood changes - Plan: ARIPiprazole (ABILIFY) 2 MG tablet  Abdominal pain, unspecified abdominal location - Plan: pantoprazole (PROTONIX) 40 MG tablet, CBC w/Diff  Nausea and vomiting, unspecified vomiting type - Plan: pantoprazole (PROTONIX) 40 MG tablet  Need for influenza vaccination - Plan: Flu Vaccine QUAD 6+ mos PF IM (Fluarix Quad PF)  Need for MMR vaccine - Plan: MMR vaccine subcutaneous  1/2.  Chronic, stable.  Continue Marinol 5 mg twice daily.  We will eventually have her sign a controlled substance contract and leave [place] a urine drug screen. 3.  I am concerned for bipolar 2.  We will prescribe Abilify 2 mg daily.  Counseling information and psychiatry information provided in her paperwork.  Follow-up in 1 month.  Can be virtual if more convenient. 4/5.  With abdominal pain/nausea and night sweats, we will follow-up on CBC.  We will also start Protonix 40 mg daily.  If no improvement, will refer to gastroenterology. The patient voiced understanding and agreement to the plan.  [place], DO 02/20/21  4:48 PM

## 2021-02-20 NOTE — Patient Instructions (Addendum)
Please consider counseling. Contact [phone] to schedule an appointment or inquire about cost/insurance coverage.  Keep the diet clean and stay active.  Give [place] 2-3 business days to get the results of your labs back.   The only lifestyle changes that have data behind them are weight loss for the overweight/obese and elevating the head of the bed. Finding out which foods/positions are triggers is important.  [facility] [address] [place], [place] [address] [phone]  [facility] [address] [place], [place] [address] [phone]  [facility] [address] [place], [place] [address] [phone]  [facility] [address], [place], [place], [phone] [address], [place], [place], [phone]  [facility] [address] [place], [place] [phone]  [facility] [address] [place], [place] [phone]  Call one of these offices sooner than later as it can take 2-3 months to get a new patient appointment.

## 2021-02-21 LAB — CBC WITH DIFFERENTIAL/PLATELET
Basophils Absolute: 0.1 10*3/uL (ref 0.0–0.1)
Basophils Relative: 0.7 % (ref 0.0–3.0)
Eosinophils Absolute: 0.1 10*3/uL (ref 0.0–0.7)
Eosinophils Relative: 1.6 % (ref 0.0–5.0)
HCT: 36 % (ref 36.0–46.0)
Hemoglobin: 11.8 g/dL — ABNORMAL LOW (ref 12.0–15.0)
Lymphocytes Relative: 33.7 % (ref 12.0–46.0)
Lymphs Abs: 2.9 10*3/uL (ref 0.7–4.0)
MCHC: 32.7 g/dL (ref 30.0–36.0)
MCV: 91 fl (ref 78.0–100.0)
Monocytes Absolute: 0.4 10*3/uL (ref 0.1–1.0)
Monocytes Relative: 5.2 % (ref 3.0–12.0)
Neutro Abs: 5.1 10*3/uL (ref 1.4–7.7)
Neutrophils Relative %: 58.8 % (ref 43.0–77.0)
Platelets: 322 10*3/uL (ref 150.0–400.0)
RBC: 3.96 Mil/uL (ref 3.87–5.11)
RDW: 13.8 % (ref 11.5–15.5)
WBC: 8.6 10*3/uL (ref 4.0–10.5)

## 2021-02-28 ENCOUNTER — Other Ambulatory Visit: Payer: Self-pay | Admitting: Family Medicine

## 2021-02-28 MED ORDER — BUSPIRONE HCL 15 MG PO TABS: 15.0000 mg | ORAL_TABLET | Freq: Every day | ORAL | 5 refills | Status: DC

## 2021-03-15 ENCOUNTER — Other Ambulatory Visit: Payer: Self-pay | Admitting: Family Medicine

## 2021-03-15 DIAGNOSIS — R109 Unspecified abdominal pain: Secondary | ICD-10-CM

## 2021-03-15 DIAGNOSIS — R112 Nausea with vomiting, unspecified: Secondary | ICD-10-CM

## 2021-03-15 DIAGNOSIS — R4586 Emotional lability: Secondary | ICD-10-CM

## 2021-03-21 ENCOUNTER — Other Ambulatory Visit: Payer: Self-pay | Admitting: Family Medicine

## 2021-03-21 ENCOUNTER — Encounter: Payer: Self-pay | Admitting: Family Medicine

## 2021-03-21 DIAGNOSIS — L709 Acne, unspecified: Secondary | ICD-10-CM

## 2021-03-21 MED ORDER — VALACYCLOVIR HCL 500 MG PO TABS: 500.0000 mg | ORAL_TABLET | Freq: Two times a day (BID) | ORAL | 1 refills | Status: DC

## 2021-03-21 MED ORDER — CLINDAMYCIN PHOSPHATE 1 % EX GEL: Freq: Two times a day (BID) | CUTANEOUS | 0 refills | Status: DC

## 2021-03-25 ENCOUNTER — Other Ambulatory Visit: Payer: Self-pay | Admitting: Family Medicine

## 2021-04-13 ENCOUNTER — Other Ambulatory Visit: Payer: Self-pay | Admitting: Family Medicine

## 2021-04-13 DIAGNOSIS — R112 Nausea with vomiting, unspecified: Secondary | ICD-10-CM

## 2021-04-13 DIAGNOSIS — R109 Unspecified abdominal pain: Secondary | ICD-10-CM

## 2021-04-18 ENCOUNTER — Other Ambulatory Visit: Payer: Self-pay | Admitting: Family Medicine

## 2021-04-18 DIAGNOSIS — L709 Acne, unspecified: Secondary | ICD-10-CM

## 2021-04-29 ENCOUNTER — Other Ambulatory Visit: Payer: Self-pay | Admitting: Family Medicine

## 2021-04-29 DIAGNOSIS — G629 Polyneuropathy, unspecified: Secondary | ICD-10-CM

## 2021-04-29 DIAGNOSIS — R109 Unspecified abdominal pain: Secondary | ICD-10-CM

## 2021-04-29 DIAGNOSIS — R202 Paresthesia of skin: Secondary | ICD-10-CM

## 2021-04-29 DIAGNOSIS — R112 Nausea with vomiting, unspecified: Secondary | ICD-10-CM

## 2021-12-27 ENCOUNTER — Other Ambulatory Visit: Payer: Self-pay | Admitting: Family Medicine

## 2022-02-15 ENCOUNTER — Other Ambulatory Visit: Payer: Self-pay | Admitting: Family Medicine

## 2022-02-15 DIAGNOSIS — R4586 Emotional lability: Secondary | ICD-10-CM

## 2022-03-12 ENCOUNTER — Other Ambulatory Visit: Payer: Self-pay | Admitting: Family Medicine

## 2022-03-12 DIAGNOSIS — R4586 Emotional lability: Secondary | ICD-10-CM

## 2022-05-16 ENCOUNTER — Encounter: Payer: Self-pay | Admitting: Family Medicine

## 2022-05-16 ENCOUNTER — Ambulatory Visit (INDEPENDENT_AMBULATORY_CARE_PROVIDER_SITE_OTHER): Payer: BC Managed Care – PPO | Admitting: Family Medicine

## 2022-05-16 VITALS — BP 108/64 | HR 87 | Temp 98.0°F | Ht 60.0 in | Wt 140.1 lb

## 2022-05-16 DIAGNOSIS — R41840 Attention and concentration deficit: Secondary | ICD-10-CM | POA: Diagnosis not present

## 2022-05-16 MED ORDER — AMPHETAMINE-DEXTROAMPHET ER 20 MG PO CP24: 20.0000 mg | ORAL_CAPSULE | Freq: Every day | ORAL | 0 refills | Status: DC

## 2022-05-16 NOTE — Patient Instructions (Addendum)
If you do not hear anything about your referral in the next 1-2 weeks, call our office and ask for an update.  Please find a GYN in your area.   Let us know if you need anything.

## 2022-05-16 NOTE — Progress Notes (Signed)
Chief Complaint  Patient presents with   Follow-up    Lack of energy concentration    Subjective: Patient is a 37 y.o. female here for inattention.  Over the past 2 years, the patient has been having increasing distraction while doing household tasks.  Interestingly, she does not have this issue while she is at work but she does consume significant more caffeine while there.  She is been try to take caffeine tablets at home which seems to help a little bit.  She has a strong family history of ADHD and her brother, mother, and children.  Anxiety/depression are well-controlled currently.  She has never had a formal evaluation.  Past Medical History:  Diagnosis Date   Anxiety    Asthma    Chicken pox    Depression    situational depression dcd all meds   Headache     Objective: BP 108/64 (BP Location: Right Arm, Patient Position: Sitting, Cuff Size: Normal)   Pulse 87   Temp 98 F (36.7 C) (Oral)   Ht 5' (1.524 m)   Wt 140 lb 2 oz (63.6 kg)   SpO2 99%   BMI 27.37 kg/m  General: Awake, appears stated age Lungs: No accessory muscle use Psych: Age appropriate judgment and insight, normal affect and mood  Assessment and Plan: Inattention - Plan: amphetamine-dextroamphetamine (ADDERALL XR) 20 MG 24 hr capsule, Ambulatory referral to Behavioral Health  Chronic, uncontrolled.  Start Adderall 20 mg daily.  Behavioral health for formal evaluation.  Follow-up in 6 weeks to recheck. The patient voiced understanding and agreement to the plan.  [place], DO 05/16/22  3:03 PM

## 2022-06-05 ENCOUNTER — Ambulatory Visit: Payer: BC Managed Care – PPO | Admitting: Adult Health

## 2022-06-26 ENCOUNTER — Ambulatory Visit (INDEPENDENT_AMBULATORY_CARE_PROVIDER_SITE_OTHER): Payer: BC Managed Care – PPO | Admitting: Adult Health

## 2022-06-26 ENCOUNTER — Encounter: Payer: Self-pay | Admitting: Adult Health

## 2022-06-26 VITALS — BP 123/74 | HR 92 | Ht 60.0 in | Wt 129.0 lb

## 2022-06-26 DIAGNOSIS — F902 Attention-deficit hyperactivity disorder, combined type: Secondary | ICD-10-CM | POA: Diagnosis not present

## 2022-06-26 NOTE — Progress Notes (Signed)
[facility] MD/PA/NP Initial Note  06/26/2022 1:56 PM Lisa Dyer  MRN:  [id number]  Chief Complaint:   HPI:   Describes mood today as - Mood symptoms - denies depression, anxiety, and irritability. Denies worry, rumination, and over thinking. Denies obsessive thoughts and acts. Reports procrastination. Mood is consistent. Stating "my mood is well managed with current medication". Would like to be tested or screened for ADD. Recently started on Adderall XR through PCP after history suggestive of ADD. Would like to have more formalized testing completed to confirm diagnosis. Reports a family history of ADD - mother, brother attention deficits. Her son was diagnosed with ADD 6 years ago. Reports academic struggles throughout elementary school. Was able to complete nursing degree - struggled in some classes - chemistry. Would like to return for a master's program, but is concerned about her success in a program. Stable interest and motivation. Taking medications as prescribed.  Energy levels varies. Active, does not have a regular exercise routine.  Enjoys some usual interests and activities. Married x 3, together for 15 years. Has 2 children 5 and 16 - both healthy. Parents local Spending time with family. Appetite adequate. Weight stable - 129 pounds. Sleeps well most nights. Averages 7 to 8 hours when home and 3 to 5 when working night shift 3 nights a week. Focus and concentration stable. Completing tasks. Managing aspects of household. Works full time as a travel Marine scientist. Denies SI or HI.  Denies AH or VH. Denies self harm. Denies substance use.   Previous medication trials:  Adderall ER, Abilify, Cymbalta, Ativan - 6 mg, Celexa, Gabapentin  Visit Diagnosis:    ICD-10-CM   1. Attention deficit hyperactivity disorder (ADHD), combined type  F90.2       Past Psychiatric History: Denies psychiatric hospitalization.   Past Medical History:  Past Medical History:  Diagnosis Date    Anxiety    Asthma    Chicken pox    Depression    situational depression dcd all meds   Headache     Past Surgical History:  Procedure Laterality Date   BREAST SURGERY     CESAREAN SECTION     CESAREAN SECTION N/A 08/23/2016   Procedure: REPEAT CESAREAN SECTION;  Surgeon: Dian Queen, MD;  Location: [place];  Service: Obstetrics;  Laterality: N/A;  Repeat -edc 5/3 -allergy to PCN, NSAIDS, tramadol need RNFA - [name] to RNFA   fallopian tube removal     HERNIA REPAIR      Family Psychiatric History: Denies any family history of mental illness.   Family History:  Family History  Problem Relation Age of Onset   Hypertension Mother    Diabetes Mother    Thyroid disease Mother    Hyperlipidemia Father    Hypertension Father    Diabetes Maternal Aunt    Heart disease Maternal Aunt    Hyperlipidemia Maternal Aunt    Heart disease Maternal Uncle    Hyperlipidemia Maternal Uncle    Diabetes Maternal Uncle    Hypertension Paternal Uncle    Arthritis Maternal Grandmother    Stroke Maternal Grandmother    Hypertension Maternal Grandmother    Hyperlipidemia Maternal Grandfather    Heart disease Maternal Grandfather    Hypertension Maternal Grandfather    Cancer Paternal Grandmother        Breast   Diabetes Paternal Grandmother    Cancer Paternal Training and development officer   Stroke Paternal Grandfather  Leukemia Sister     Social History:  Social History   Socioeconomic History   Marital status: Married    Spouse name: Not on file   Number of children: Not on file   Years of education: Not on file   Highest education level: Not on file  Occupational History   Not on file  Tobacco Use   Smoking status: Former    Types: Cigarettes    Quit date: 11/20/2015    Years since quitting: 6.6   Smokeless tobacco: Never  Substance and Sexual Activity   Alcohol use: Yes    Comment: occasional   Drug use: No   Sexual activity: Yes    Birth  control/protection: None  Other Topics Concern   Not on file  Social History Narrative   Not on file   Social Determinants of Health   Financial Resource Strain: Not on file  Food Insecurity: Not on file  Transportation Needs: Not on file  Physical Activity: Not on file  Stress: Not on file  Social Connections: Not on file    Allergies:  Allergies  Allergen Reactions   Nsaids Anaphylaxis   Penicillins Rash    Has patient had a PCN reaction causing immediate rash, facial/tongue/throat swelling, SOB or lightheadedness with hypotension: Yes Has patient had a PCN reaction causing severe rash involving mucus membranes or skin necrosis: Yes Has patient had a PCN reaction that required hospitalization No Has patient had a PCN reaction occurring within the last 10 years: No If all of the above answers are "NO", then may proceed with Cephalosporin use.   Tramadol Other (See Comments)    Severe vomiting    Metabolic Disorder Labs: Lab Results  Component Value Date   HGBA1C 5.5 05/24/2020   No results found for: "PROLACTIN" Lab Results  Component Value Date   CHOL 218 (H) 12/23/2019   TRIG 118 12/23/2019   HDL 59 12/23/2019   CHOLHDL 3.7 12/23/2019   VLDL 15.0 11/23/2015   LDLCALC 136 (H) 12/23/2019   LDLCALC 95 11/23/2015   Lab Results  Component Value Date   TSH 1.11 11/29/2020   TSH 0.36 05/24/2020    Therapeutic Level Labs: No results found for: "LITHIUM" No results found for: "VALPROATE" No results found for: "CBMZ"  Current Medications: Current Outpatient Medications  Medication Sig Dispense Refill   amphetamine-dextroamphetamine (ADDERALL XR) 20 MG 24 hr capsule Take 1 capsule (20 mg total) by mouth daily. 30 capsule 0   ARIPiprazole (ABILIFY) 2 MG tablet TAKE 1 TABLET BY MOUTH EVERY DAY 90 tablet 1   benzoyl peroxide (BENZOYL PEROXIDE) 5 % external liquid Apply topically 2 (two) times daily. 142 g 12   busPIRone (BUSPAR) 15 MG tablet TAKE 1 TABLET (15 MG  TOTAL) BY MOUTH DAILY. 90 tablet 2   gabapentin (NEURONTIN) 300 MG capsule TAKE 2 CAPSULES (600 MG TOTAL) BY MOUTH 4 (FOUR) TIMES DAILY. 240 capsule 1   pantoprazole (PROTONIX) 40 MG tablet TAKE 1 TABLET BY MOUTH EVERY DAY 90 tablet 0   promethazine (PHENERGAN) 25 MG tablet TAKE 1 TABLET (25 MG TOTAL) BY MOUTH EVERY 8 (EIGHT) HOURS AS NEEDED FOR NAUSEA OR VOMITING. 270 tablet 2   TRI-ESTARYLLA 0.18/0.215/0.25 MG-35 MCG tablet TAKE 1 TABLET BY MOUTH EVERY DAY 84 tablet 3   valACYclovir (VALTREX) 500 MG tablet TAKE 1 TABLET BY MOUTH TWICE A DAY 180 tablet 1   No current facility-administered medications for this visit.    Medication Side Effects: none  Orders  placed this visit:  No orders of the defined types were placed in this encounter.   Psychiatric Specialty Exam:  Review of Systems  Musculoskeletal:  Negative for gait problem.  Neurological:  Negative for tremors.  Psychiatric/Behavioral:         Please refer to HPI    Blood pressure 123/74.There is no height or weight on file to calculate BMI.  General Appearance: Casual and Neat  Eye Contact:  Good  Speech:  Clear and Coherent and Normal Rate  Volume:  Normal  Mood:  Euthymic  Affect:  Appropriate and Congruent  Thought Process:  Coherent and Descriptions of Associations: Intact  Orientation:  Full (Time, Place, and Person)  Thought Content: Logical   Suicidal Thoughts:  No  Homicidal Thoughts:  No  Memory:  WNL  Judgement:  Good  Insight:  Good  Psychomotor Activity:  Normal  Concentration:  Concentration: Good and Attention Span: Good  Recall:  Good  Fund of Knowledge: Good  Language: Good  Assets:  Communication Skills Desire for Improvement Financial Resources/Insurance Housing Intimacy Leisure Time Physical Health Resilience Social Support Talents/Skills Transportation Vocational/Educational  ADL's:  Intact  Cognition: WNL  Prognosis:  Good   Screenings:  PHQ2-9    [place] Office Visit  from 05/16/2022 in [facility] Visit from 11/29/2020 in [facility]  PHQ-2 Total Score 0 1       Receiving Psychotherapy: No   Treatment Plan/Recommendations:   Plan:  PDMP reviewed  Continue Adderall XR '20mg'$  daily for attention issues.  RTC as needed  [facility] ADD testing completed 43/58 ADD possible. Meets diagnostic criteria for diagnosis of ADD  Patient advised to contact office with any questions, adverse effects, or acute worsening in signs and symptoms.   Discussed potential benefits, risks, and side effects of stimulants with patient to include increased heart rate, palpitations, insomnia, increased anxiety, increased irritability, or decreased appetite.  Instructed patient to contact office if experiencing any significant tolerability issues.    Aloha Gell, NP

## 2022-07-12 ENCOUNTER — Other Ambulatory Visit: Payer: Self-pay | Admitting: Family Medicine

## 2022-07-12 DIAGNOSIS — R41840 Attention and concentration deficit: Secondary | ICD-10-CM

## 2022-07-12 MED ORDER — AMPHETAMINE-DEXTROAMPHET ER 20 MG PO CP24: 20.0000 mg | ORAL_CAPSULE | Freq: Every day | ORAL | 0 refills | Status: DC

## 2022-07-12 NOTE — Telephone Encounter (Signed)
Requesting: Adderall XR '20mg'$   Contract: None UDS: None Last Visit: 06/26/22 Next Visit: None Last Refill: 05/16/22 #30 and 0RF  Please Advise

## 2022-08-20 ENCOUNTER — Other Ambulatory Visit: Payer: Self-pay | Admitting: Family Medicine

## 2022-08-20 DIAGNOSIS — R41840 Attention and concentration deficit: Secondary | ICD-10-CM

## 2022-08-20 NOTE — Telephone Encounter (Signed)
Last OV--05/06/2022 Last RF--07/12/2022

## 2022-08-20 NOTE — Telephone Encounter (Signed)
She was supposed to follow-up with me in early March, is psychiatry filling this now?  Thank you.

## 2022-08-20 NOTE — Telephone Encounter (Signed)
Requesting: Adderall XR   Contract: None UDS: None Last Visit: 05/16/22 Next Visit: None Last Refill: 07/12/22 #30 and 0RF   Please Advise

## 2022-08-21 NOTE — Telephone Encounter (Signed)
Spoke to the patient and scheduled (double booked with her son) 08/22/22 video visit.

## 2022-08-21 NOTE — Telephone Encounter (Signed)
She went just for an evaluation But thought it was for the initial evaluation only Unless you would prefer her go back and that provider fill this at this time she did not plan on returning.

## 2022-08-21 NOTE — Telephone Encounter (Signed)
Called left message to call back 

## 2022-08-22 ENCOUNTER — Telehealth (INDEPENDENT_AMBULATORY_CARE_PROVIDER_SITE_OTHER): Payer: BC Managed Care – PPO | Admitting: Family Medicine

## 2022-08-22 ENCOUNTER — Encounter: Payer: Self-pay | Admitting: Family Medicine

## 2022-08-22 DIAGNOSIS — F902 Attention-deficit hyperactivity disorder, combined type: Secondary | ICD-10-CM | POA: Diagnosis not present

## 2022-08-22 MED ORDER — AMPHETAMINE-DEXTROAMPHET ER 20 MG PO CP24: 20.0000 mg | ORAL_CAPSULE | Freq: Every day | ORAL | 0 refills | Status: DC

## 2022-08-22 MED ORDER — AMPHETAMINE-DEXTROAMPHETAMINE 10 MG PO TABS: ORAL_TABLET | ORAL | 0 refills | Status: DC

## 2022-08-22 NOTE — Progress Notes (Signed)
Chief Complaint  Patient presents with   Follow-up    medication    Lisa Dyer is 37 y.o. female here for ADHD follow up. We are interacting via web portal for an electronic face-to-face visit. I verified patient's ID using 2 identifiers. Patient agreed to proceed with visit via this method. Patient is at home, I am at office. Patient and I are present for visit.   Patient is currently on Adderall XR 20 mg daily and compliance is excellent. Symptoms are well-controlled on this regimen. Side effects include none. It seems to wear off in the early afternoon. Patient believes their dose should be not significantly changed. Denies tics, weight loss, difficulties with sleep, self-medication, alcohol/drug abuse, chest pain, or palpitations.   Past Medical History:  Diagnosis Date   Anxiety    Asthma    Chicken pox    Depression    situational depression dcd all meds   Headache    Objective No conversational dyspnea Age appropriate judgment and insight Nml affect and mood  Attention deficit hyperactivity disorder (ADHD), combined type - Plan: amphetamine-dextroamphetamine (ADDERALL XR) 20 MG 24 hr capsule, amphetamine-dextroamphetamine (ADDERALL XR) 20 MG 24 hr capsule, amphetamine-dextroamphetamine (ADDERALL) 10 MG tablet, amphetamine-dextroamphetamine (ADDERALL XR) 20 MG 24 hr capsule, amphetamine-dextroamphetamine (ADDERALL) 10 MG tablet, amphetamine-dextroamphetamine (ADDERALL) 10 MG tablet  Chronic, not fully controlled.  Continue Adderall XR 20 mg daily in the morning and add a 10 mg afternoon dosage of Adderall to get her through the rest of the day.  She will send a message if we are not improving on this. F/u in 6 mo for CPE. Pt voiced understanding and agreement to the plan.  Jilda Roche [place], DO 08/22/22 4:28 PM

## 2022-10-16 ENCOUNTER — Ambulatory Visit (INDEPENDENT_AMBULATORY_CARE_PROVIDER_SITE_OTHER): Payer: BC Managed Care – PPO | Admitting: Family Medicine

## 2022-10-16 ENCOUNTER — Encounter: Payer: Self-pay | Admitting: Family Medicine

## 2022-10-16 VITALS — BP 120/72 | HR 84 | Temp 98.0°F | Ht 60.0 in | Wt 119.2 lb

## 2022-10-16 DIAGNOSIS — L989 Disorder of the skin and subcutaneous tissue, unspecified: Secondary | ICD-10-CM | POA: Diagnosis not present

## 2022-10-16 DIAGNOSIS — F411 Generalized anxiety disorder: Secondary | ICD-10-CM

## 2022-10-16 DIAGNOSIS — K439 Ventral hernia without obstruction or gangrene: Secondary | ICD-10-CM

## 2022-10-16 MED ORDER — HYDROXYZINE HCL 25 MG PO TABS: 12.5000 mg | ORAL_TABLET | Freq: Three times a day (TID) | ORAL | 0 refills | Status: AC | PRN

## 2022-10-16 NOTE — Patient Instructions (Signed)
If you do not hear anything about your referral in the next 1-2 weeks, call our office and ask for an update.  Use some baby shampoo over the lids twice daily for a few weeks and let me know how things go.   Stay active.   Let us know if you need anything.

## 2022-10-16 NOTE — Addendum Note (Signed)
Addended by: Radene Gunning on: 10/16/2022 12:16 PM   Modules accepted: Level of Service

## 2022-10-16 NOTE — Progress Notes (Signed)
Chief Complaint  Patient presents with   Stress   Hernia    Subjective: Patient is a 37 y.o. female here for follow-up.  17 years ago, thought to have a hernia during her pregnancy.  She has recently lost weight and notices a bulge when she stands up just above her bellybutton.  There is no pain or bowel change.  She denies any fevers or skin changes over the area.  If possible, she would like it taken care of.  Patient has a history of anxiety.  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  She has a very stressful work environment and would like something to take as needed that is not a benzodiazepine.  She is currently taking BuSpar 15 mg daily and Abilify 2 mg daily.  Over the past several weeks, has noticed a spot on the left portion of her lower eyelid.  She picks at it when she knows that she should not.  There is no drainage, redness, or pain.  She feels there is a small area on her right upper eyelid that is developing as well.  Past Medical History:  Diagnosis Date   Anxiety    Asthma    Chicken pox    Depression    situational depression dcd all meds   Headache     Objective: BP 120/72 (BP Location: Left Arm, Patient Position: Sitting, Cuff Size: Normal)   Pulse 84   Temp 98 F (36.7 C) (Oral)   Ht 5' (1.524 m)   Wt 119 lb 4 oz (54.1 kg)   SpO2 99%   BMI 23.29 kg/m  General: Awake, appears stated age Skin: Small pustule over the lateral left lower lid without surrounding erythema or TTP.  There is no fluctuance.  Appears to be a similar lesion on the left upper lid at the top of the globe.  Similarly, there is no fluctuance, TTP, erythema, or drainage. Heart: RRR, no LE edema Lungs: CTAB, no rales, wheezes or rhonchi. No accessory muscle use Abdomen: There is no bulge over the umbilical region.  Proximal to mid, there is a small ventral bulge that increases with Valsalva.  There is no TTP or fluctuance. Psych: Age appropriate  judgment and insight, normal affect and mood  Assessment and Plan: Ventral hernia without obstruction or gangrene - Plan: Ambulatory referral to General Surgery  GAD (generalized anxiety disorder)  Skin lesion  Refer to general surgery at her request.  No signs of obstruction.  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 Chronic, uncontrolled.  Continue Abilify 2 mg daily, BuSpar 15 mg daily.  Add hydroxyzine 12.5-75 mg 3 times daily prn.  Follow-up in 1 month to recheck this.  Could consider low-dose propranolol, she will monitor her blood pressure at home. Baby shampoo, warm compresses, refer to ophthalmology if no improvement. The patient voiced understanding and agreement to the plan.  Jilda Roche [place], DO 10/16/22  12:16 PM

## 2022-11-15 DIAGNOSIS — M6208 Separation of muscle (nontraumatic), other site: Secondary | ICD-10-CM | POA: Diagnosis not present

## 2022-11-16 ENCOUNTER — Other Ambulatory Visit: Payer: Self-pay | Admitting: Family Medicine

## 2022-11-16 DIAGNOSIS — R4586 Emotional lability: Secondary | ICD-10-CM

## 2022-11-26 ENCOUNTER — Institutional Professional Consult (permissible substitution): Payer: BC Managed Care – PPO | Admitting: Plastic Surgery

## 2022-12-19 ENCOUNTER — Other Ambulatory Visit: Payer: Self-pay | Admitting: Family Medicine

## 2022-12-19 DIAGNOSIS — F902 Attention-deficit hyperactivity disorder, combined type: Secondary | ICD-10-CM

## 2022-12-19 MED ORDER — AMPHETAMINE-DEXTROAMPHET ER 20 MG PO CP24: 20.0000 mg | ORAL_CAPSULE | Freq: Every day | ORAL | 0 refills | Status: DC

## 2022-12-19 MED ORDER — AMPHETAMINE-DEXTROAMPHETAMINE 10 MG PO TABS: ORAL_TABLET | ORAL | 0 refills | Status: DC

## 2022-12-19 MED ORDER — AMPHETAMINE-DEXTROAMPHETAMINE 10 MG PO TABS
ORAL_TABLET | ORAL | 0 refills | Status: DC
Start: 2023-02-17 — End: 2023-04-04

## 2022-12-19 MED ORDER — AMPHETAMINE-DEXTROAMPHET ER 20 MG PO CP24
20.0000 mg | ORAL_CAPSULE | Freq: Every day | ORAL | 0 refills | Status: DC
Start: 2023-01-18 — End: 2023-04-04

## 2022-12-19 NOTE — Telephone Encounter (Signed)
Last OV--10/21/22 Last RF--10/16/22

## 2023-01-18 ENCOUNTER — Institutional Professional Consult (permissible substitution): Payer: BC Managed Care – PPO | Admitting: Plastic Surgery

## 2023-02-01 DIAGNOSIS — F340 Cyclothymic disorder: Secondary | ICD-10-CM | POA: Diagnosis not present

## 2023-02-01 DIAGNOSIS — F411 Generalized anxiety disorder: Secondary | ICD-10-CM | POA: Diagnosis not present

## 2023-02-11 ENCOUNTER — Ambulatory Visit
Admission: EM | Admit: 2023-02-11 | Discharge: 2023-02-11 | Discharge status: Against Medical Advice | Payer: BC Managed Care – PPO

## 2023-02-11 NOTE — ED Notes (Signed)
Attempted to contact patient again for rooming. Phone sent immediately to voicemail.

## 2023-02-11 NOTE — ED Notes (Signed)
Attempted to contact patient for rooming via number provided. No answer when contacted.

## 2023-02-16 DIAGNOSIS — S61012A Laceration without foreign body of left thumb without damage to nail, initial encounter: Secondary | ICD-10-CM | POA: Diagnosis not present

## 2023-02-20 ENCOUNTER — Ambulatory Visit: Payer: BC Managed Care – PPO | Admitting: Family Medicine

## 2023-02-20 ENCOUNTER — Encounter: Payer: Self-pay | Admitting: Family Medicine

## 2023-02-20 VITALS — BP 120/80 | HR 85 | Temp 98.3°F | Ht 60.0 in | Wt 124.0 lb

## 2023-02-20 DIAGNOSIS — Z23 Encounter for immunization: Secondary | ICD-10-CM

## 2023-02-20 DIAGNOSIS — S61012A Laceration without foreign body of left thumb without damage to nail, initial encounter: Secondary | ICD-10-CM

## 2023-02-20 NOTE — Progress Notes (Signed)
Chief Complaint  Patient presents with   Follow-up    Cut her finger last Monday and was seen by UC. Taken off Ability due to intolerance    Lisa Dyer is a 37 y.o. female here for a skin complaint.  Duration: 9 days Location: L thumb Pruritic? No Painful? Yes; markedly improved Drainage? No Cut finger w knife Other associated symptoms: no fevers Therapies tried thus far: taking Clindamycin Unfortunately she was unable to work due to her injury. Her last tetanus shot was in 2017.  Past Medical History:  Diagnosis Date   Anxiety    Asthma    Chicken pox    Depression    situational depression dcd all meds   Headache     BP 120/80 (BP Location: Left Arm, Patient Position: Sitting, Cuff Size: Normal)   Pulse 85   Temp 98.3 F (36.8 C) (Oral)   Ht 5' (1.524 m)   Wt 124 lb (56.2 kg)   SpO2 99%   BMI 24.22 kg/m  Gen: awake, alert, appearing stated age Lungs: No accessory muscle use Skin: Scabbed laceration along the left volar surface of the thumb.  There is no surrounding erythema, fluctuance, drainage, or edema.  Mild TTP over the area. Psych: Age appropriate judgment and insight  Laceration of left thumb without foreign body without damage to nail, initial encounter - Plan: Tdap vaccine greater than or equal to 7yo IM  Update tetanus shot today.  Finish course of clindamycin.  There is no sign of infection today.  Letter for work provided excusing from 10/16-10/20.  Ice, Tylenol, ibuprofen as needed.  Pain is markedly improved. F/u prn. The patient voiced understanding and agreement to the plan.  [name], DO 02/20/23 4:34 PM

## 2023-02-20 NOTE — Patient Instructions (Signed)
Ice/cold pack over area for 10-15 min twice daily.  OK to take Tylenol 1000 mg (2 extra strength tabs) or 975 mg (3 regular strength tabs) every 6 hours as needed.  Ibuprofen 400-600 mg (2-3 over the counter strength tabs) every 6 hours as needed for pain.  Let us know if you need anything. 

## 2023-03-01 DIAGNOSIS — F411 Generalized anxiety disorder: Secondary | ICD-10-CM | POA: Diagnosis not present

## 2023-03-01 DIAGNOSIS — F340 Cyclothymic disorder: Secondary | ICD-10-CM | POA: Diagnosis not present

## 2023-04-01 DIAGNOSIS — F411 Generalized anxiety disorder: Secondary | ICD-10-CM | POA: Diagnosis not present

## 2023-04-01 DIAGNOSIS — F340 Cyclothymic disorder: Secondary | ICD-10-CM | POA: Diagnosis not present

## 2023-04-03 ENCOUNTER — Other Ambulatory Visit: Payer: Self-pay | Admitting: Family Medicine

## 2023-04-03 DIAGNOSIS — F902 Attention-deficit hyperactivity disorder, combined type: Secondary | ICD-10-CM

## 2023-04-04 MED ORDER — AMPHETAMINE-DEXTROAMPHET ER 20 MG PO CP24
20.0000 mg | ORAL_CAPSULE | Freq: Every day | ORAL | 0 refills | Status: AC
Start: 2023-05-04 — End: ?

## 2023-04-04 MED ORDER — AMPHETAMINE-DEXTROAMPHET ER 20 MG PO CP24: 20.0000 mg | ORAL_CAPSULE | Freq: Every day | ORAL | 0 refills | Status: AC

## 2023-04-04 MED ORDER — AMPHETAMINE-DEXTROAMPHET ER 20 MG PO CP24
20.0000 mg | ORAL_CAPSULE | Freq: Every day | ORAL | 0 refills | Status: DC
Start: 2023-04-04 — End: 2023-09-27

## 2023-04-04 MED ORDER — AMPHETAMINE-DEXTROAMPHETAMINE 10 MG PO TABS
ORAL_TABLET | ORAL | 0 refills | Status: DC
Start: 2023-04-04 — End: 2023-09-27

## 2023-04-04 MED ORDER — AMPHETAMINE-DEXTROAMPHETAMINE 10 MG PO TABS: ORAL_TABLET | ORAL | 0 refills | Status: AC

## 2023-04-04 MED ORDER — AMPHETAMINE-DEXTROAMPHETAMINE 10 MG PO TABS
ORAL_TABLET | ORAL | 0 refills | Status: AC
Start: 2023-05-04 — End: ?

## 2023-04-04 NOTE — Telephone Encounter (Signed)
Pt due for CPE. Please sched as this will be required before her next refill. Ty.

## 2023-04-05 ENCOUNTER — Other Ambulatory Visit: Payer: Self-pay | Admitting: Family Medicine

## 2023-04-25 ENCOUNTER — Other Ambulatory Visit: Payer: Self-pay | Admitting: Family Medicine

## 2023-05-02 DIAGNOSIS — F411 Generalized anxiety disorder: Secondary | ICD-10-CM | POA: Diagnosis not present

## 2023-05-02 DIAGNOSIS — F340 Cyclothymic disorder: Secondary | ICD-10-CM | POA: Diagnosis not present

## 2023-05-03 ENCOUNTER — Encounter: Payer: Self-pay | Admitting: Family Medicine

## 2023-05-08 ENCOUNTER — Encounter: Payer: BC Managed Care – PPO | Admitting: Family Medicine

## 2023-07-29 DIAGNOSIS — F411 Generalized anxiety disorder: Secondary | ICD-10-CM | POA: Diagnosis not present

## 2023-07-29 DIAGNOSIS — F340 Cyclothymic disorder: Secondary | ICD-10-CM | POA: Diagnosis not present

## 2023-09-27 ENCOUNTER — Encounter: Payer: Self-pay | Admitting: Family Medicine

## 2023-09-27 ENCOUNTER — Other Ambulatory Visit: Payer: Self-pay | Admitting: Family Medicine

## 2023-09-27 DIAGNOSIS — F902 Attention-deficit hyperactivity disorder, combined type: Secondary | ICD-10-CM

## 2023-09-27 MED ORDER — AMPHETAMINE-DEXTROAMPHETAMINE 10 MG PO TABS: ORAL_TABLET | ORAL | 0 refills | Status: AC

## 2023-09-27 MED ORDER — AMPHETAMINE-DEXTROAMPHET ER 20 MG PO CP24: 20.0000 mg | ORAL_CAPSULE | Freq: Every day | ORAL | 0 refills | Status: AC

## 2023-10-18 DIAGNOSIS — F411 Generalized anxiety disorder: Secondary | ICD-10-CM | POA: Diagnosis not present

## 2023-10-18 DIAGNOSIS — F340 Cyclothymic disorder: Secondary | ICD-10-CM | POA: Diagnosis not present

## 2023-11-14 ENCOUNTER — Encounter: Payer: Self-pay | Admitting: Family Medicine

## 2023-12-25 ENCOUNTER — Other Ambulatory Visit: Payer: Self-pay | Admitting: Family Medicine

## 2024-01-17 DIAGNOSIS — F902 Attention-deficit hyperactivity disorder, combined type: Secondary | ICD-10-CM | POA: Diagnosis not present

## 2024-01-17 DIAGNOSIS — F340 Cyclothymic disorder: Secondary | ICD-10-CM | POA: Diagnosis not present

## 2024-01-17 DIAGNOSIS — F411 Generalized anxiety disorder: Secondary | ICD-10-CM | POA: Diagnosis not present
